# Patient Record
Sex: Female | Born: 1995 | Hispanic: Yes | Marital: Single | Smoking: Never smoker
Health system: Southern US, Community
[De-identification: ages and names within clinical notes are randomized; demographics above are authoritative.]

## PROBLEM LIST (undated history)

## (undated) ENCOUNTER — Emergency Department (HOSPITAL_COMMUNITY): Admission: EM | Payer: Self-pay

---

## 2018-09-06 ENCOUNTER — Emergency Department (HOSPITAL_COMMUNITY)
Admission: EM | Admit: 2018-09-06 | Discharge: 2018-09-06 | Disposition: A | Discharge status: Home / Self Care | Payer: Self-pay | Attending: Emergency Medicine | Admitting: Emergency Medicine

## 2018-09-06 ENCOUNTER — Encounter (HOSPITAL_COMMUNITY): Payer: Self-pay | Admitting: *Deleted

## 2018-09-06 ENCOUNTER — Other Ambulatory Visit: Payer: Self-pay

## 2018-09-06 ENCOUNTER — Emergency Department (HOSPITAL_COMMUNITY): Payer: Self-pay

## 2018-09-06 DIAGNOSIS — Y939 Activity, unspecified: Secondary | ICD-10-CM | POA: Insufficient documentation

## 2018-09-06 DIAGNOSIS — X501XXA Overexertion from prolonged static or awkward postures, initial encounter: Secondary | ICD-10-CM | POA: Insufficient documentation

## 2018-09-06 DIAGNOSIS — Y999 Unspecified external cause status: Secondary | ICD-10-CM | POA: Insufficient documentation

## 2018-09-06 DIAGNOSIS — Y929 Unspecified place or not applicable: Secondary | ICD-10-CM | POA: Insufficient documentation

## 2018-09-06 DIAGNOSIS — W19XXXA Unspecified fall, initial encounter: Secondary | ICD-10-CM

## 2018-09-06 DIAGNOSIS — S93401A Sprain of unspecified ligament of right ankle, initial encounter: Secondary | ICD-10-CM | POA: Insufficient documentation

## 2018-09-06 MED ORDER — IBUPROFEN 800 MG PO TABS: 800.0000 mg | ORAL_TABLET | Freq: Three times a day (TID) | ORAL | 0 refills | Status: DC

## 2018-09-06 MED ORDER — IBUPROFEN 800 MG PO TABS
800.0000 mg | ORAL_TABLET | Freq: Once | ORAL | Status: AC
  Administered 2018-09-06: 800 mg via ORAL
  Filled 2018-09-06: qty 1

## 2018-09-06 NOTE — Discharge Instructions (Signed)
Take the prescribed medication as directed.  Can ice and elevate ankle to help with pain/swelling.  Progress back to weight bearing as tolerated. Follow-up with orthopedics if any ongoing issues. Return to the ED for new or worsening symptoms.

## 2018-09-06 NOTE — ED Notes (Signed)
Patient transported to X-ray 

## 2018-09-06 NOTE — ED Provider Notes (Signed)
[facility] EMERGENCY DEPARTMENT Provider Note   CSN: [id number] Arrival date & time: 09/06/18  0116    History   Chief Complaint Chief Complaint  Patient presents with  . Fall    HPI Caitlin Herrera is a 23 y.o. female.     The history is provided by the patient and medical records. The history is limited by a language barrier. A language interpreter was used.  Fall      23 year old female presenting to the ED after a fall.  States she tried to jump over a step and twisted her right ankle.  She also scraped up her left foot but denies any pain in this area.  Pain along the lateral right ankle.  States she was not able to bear weight on ankle or foot after injury.  She denies any numbness or weakness.  No prior right ankle injuries or surgeries.  No intervention tried prior to arrival.  History reviewed. No pertinent past medical history.  There are no active problems to display for this patient.   History reviewed. No pertinent surgical history.   OB History   No obstetric history on file.      Home Medications    Prior to Admission medications   Not on File    Family History No family history on file.  Social History Social History   Tobacco Use  . Smoking status: Never Smoker  Substance Use Topics  . Alcohol use: Never    Frequency: Never  . Drug use: Never     Allergies   Patient has no known allergies.   Review of Systems Review of Systems  Musculoskeletal: Positive for arthralgias.  All other systems reviewed and are negative.    Physical Exam Updated Vital Signs BP 137/86 (BP Location: Right Arm)   Pulse (!) 110   Temp 98.3 F (36.8 C) (Oral)   Resp 18   LMP 09/03/2018   SpO2 99%   Physical Exam Vitals signs and nursing note reviewed.  Constitutional:      Appearance: She is well-developed.  HENT:     Head: Normocephalic and atraumatic.  Eyes:     Conjunctiva/sclera: Conjunctivae normal.     Pupils: Pupils  are equal, round, and reactive to light.  Neck:     Musculoskeletal: Normal range of motion.  Cardiovascular:     Rate and Rhythm: Normal rate and regular rhythm.     Heart sounds: Normal heart sounds.  Pulmonary:     Effort: Pulmonary effort is normal.     Breath sounds: Normal breath sounds.  Abdominal:     General: Bowel sounds are normal.     Palpations: Abdomen is soft.  Musculoskeletal: Normal range of motion.     Comments: Abrasions noted to the left great toe, denies pain of left ankle or foot  Right ankle with tenderness surrounding the lateral malleolus, very mild swelling but no appreciable bony deformities, pain with attempted range of motion; abrasion noted to dorsal right foot but no open wound requiring repair, DP pulse intact bilaterally, moving toes normally  Skin:    General: Skin is warm and dry.  Neurological:     Mental Status: She is alert and oriented to person, place, and time.      ED Treatments / Results  Labs (all labs ordered are listed, but only abnormal results are displayed) Labs Reviewed - No data to display  EKG None  Radiology Dg Ankle Complete Right  Result Date:  09/06/2018 CLINICAL DATA:  Right ankle and foot pain after a fall tonight. EXAM: RIGHT ANKLE - COMPLETE 3+ VIEW COMPARISON:  None. FINDINGS: There is no evidence of fracture, dislocation, or joint effusion. There is no evidence of arthropathy or other focal bone abnormality. Soft tissues are unremarkable. IMPRESSION: Negative. Electronically Signed   By: Burman Nieves M.D.   On: 09/06/2018 01:57   Dg Foot Complete Right  Result Date: 09/06/2018 CLINICAL DATA:  Right ankle and foot pain after a fall tonight. Swelling and abrasion to the right ankle. Abrasion to the left toe. EXAM: RIGHT FOOT COMPLETE - 3+ VIEW COMPARISON:  None. FINDINGS: There is no evidence of fracture or dislocation. There is no evidence of arthropathy or other focal bone abnormality. Soft tissues are  unremarkable. IMPRESSION: Negative. Electronically Signed   By: Burman Nieves M.D.   On: 09/06/2018 01:57    Procedures Procedures (including critical care time)  Medications Ordered in ED Medications - No data to display   Initial Impression / Assessment and Plan / ED Course  I have reviewed the triage vital signs and the nursing notes.  Pertinent labs & imaging results that were available during my care of the patient were reviewed by me and considered in my medical decision making (see chart for details).  23 year old female here after a fall.  She tried to jump down a step when she twisted her right ankle.  There was no head injury or loss of consciousness.  She has abrasions to the left great toe but denies any pain of this foot.  Small abrasion to dorsal right foot but no wounds requiring repair.  She has no acute bony deformities.  Some mild tenderness surrounding the lateral malleolus.  X-rays are negative.  Suspect sprain.  Patient placed in ASO given crutches.  Encouraged ice and elevation, anti-inflammatories.  Given orthopedic follow-up if any ongoing issues.  Return here for any new/acute changes.  Final Clinical Impressions(s) / ED Diagnoses   Final diagnoses:  Fall, initial encounter  Sprain of right ankle, unspecified ligament, initial encounter    ED Discharge Orders         Ordered    ibuprofen (ADVIL,MOTRIN) 800 MG tablet  3 times daily     09/06/18 0401           Garlon Hatchet, PA-C 09/06/18 0417    Gilda Crease, MD 09/06/18 [phone]

## 2018-09-06 NOTE — ED Triage Notes (Signed)
PT arrives with c/o right ankle and foot pain after a fall tonight. Says she missed the step and fell. She has swelling and abrasion to the right ankle. Abrasion to the left toe, but reports that does not hurt. Unknown tetanus. Video interpreter used for triage.

## 2018-09-06 NOTE — ED Notes (Signed)
Pt discharged from ED; instructions provided and scripts given; Pt encouraged to return to ED if symptoms worsen and to f/u with PCP; Pt verbalized understanding of all instructions 

## 2018-12-28 ENCOUNTER — Telehealth: Payer: Self-pay | Admitting: Family Medicine

## 2018-12-28 NOTE — Telephone Encounter (Signed)
Patient called about making an appointment to get her Nexplanon removed. The interpreter [phone] assisted me with explaining to her how much it would cost to get it removed. I explained to her it would be [facility] to [facility]. She stated it's ok she would  Pay the total. Instructed her it was 55% of total.

## 2019-02-12 ENCOUNTER — Ambulatory Visit (INDEPENDENT_AMBULATORY_CARE_PROVIDER_SITE_OTHER): Payer: Self-pay | Admitting: Family Medicine

## 2019-02-12 ENCOUNTER — Other Ambulatory Visit: Payer: Self-pay

## 2019-02-12 VITALS — Wt 165.0 lb

## 2019-02-12 DIAGNOSIS — Z3046 Encounter for surveillance of implantable subdermal contraceptive: Secondary | ICD-10-CM

## 2019-02-12 NOTE — Progress Notes (Signed)
Nexplanon Removal:  Patient given informed consent for removal of her Implanon, time out was performed.  Signed copy in the chart.  Appropriate time out taken. Implanon site identified.  Area prepped in usual sterile fashon. One cc of 1% lidocaine was used to anesthetize the area at the distal end of the implant. A small stab incision was made right beside the implant on the distal portion.  The implanon rod was grasped using hemostats and removed without difficulty.  There was less than 3 cc blood loss. There were no complications.  A small amount of antibiotic ointment and steri-strips were applied over the small incision.  A pressure bandage was applied to reduce any bruising.  The patient tolerated the procedure well and was given post procedure instructions. 

## 2019-02-15 ENCOUNTER — Encounter: Payer: Self-pay | Admitting: General Practice

## 2020-05-02 ENCOUNTER — Inpatient Hospital Stay (HOSPITAL_COMMUNITY): Payer: Self-pay

## 2020-05-02 ENCOUNTER — Other Ambulatory Visit: Payer: Self-pay

## 2020-05-02 ENCOUNTER — Encounter (HOSPITAL_COMMUNITY): Payer: Self-pay | Admitting: Obstetrics and Gynecology

## 2020-05-02 ENCOUNTER — Inpatient Hospital Stay (HOSPITAL_COMMUNITY)
Admission: AD | Admit: 2020-05-02 | Discharge: 2020-05-02 | Disposition: A | Discharge status: Home / Self Care | Payer: Self-pay | Attending: Obstetrics and Gynecology | Admitting: Obstetrics and Gynecology

## 2020-05-02 DIAGNOSIS — Z3A08 8 weeks gestation of pregnancy: Secondary | ICD-10-CM | POA: Insufficient documentation

## 2020-05-02 DIAGNOSIS — O26899 Other specified pregnancy related conditions, unspecified trimester: Secondary | ICD-10-CM

## 2020-05-02 DIAGNOSIS — R109 Unspecified abdominal pain: Secondary | ICD-10-CM | POA: Insufficient documentation

## 2020-05-02 DIAGNOSIS — O039 Complete or unspecified spontaneous abortion without complication: Secondary | ICD-10-CM | POA: Insufficient documentation

## 2020-05-02 LAB — URINALYSIS, ROUTINE W REFLEX MICROSCOPIC
Bacteria, UA: NONE SEEN
Bilirubin Urine: NEGATIVE
Glucose, UA: NEGATIVE mg/dL
Ketones, ur: NEGATIVE mg/dL
Leukocytes,Ua: NEGATIVE
Nitrite: NEGATIVE
Protein, ur: NEGATIVE mg/dL
Specific Gravity, Urine: 1.012 (ref 1.005–1.030)
pH: 8 (ref 5.0–8.0)

## 2020-05-02 LAB — POC URINE PREG, ED: Preg Test, Ur: POSITIVE — AB

## 2020-05-02 LAB — WET PREP, GENITAL
Clue Cells Wet Prep HPF POC: NONE SEEN
Sperm: NONE SEEN
Trich, Wet Prep: NONE SEEN
Yeast Wet Prep HPF POC: NONE SEEN

## 2020-05-02 LAB — CBC
HCT: 39.6 % (ref 36.0–46.0)
Hemoglobin: 13.1 g/dL (ref 12.0–15.0)
MCH: 27.6 pg (ref 26.0–34.0)
MCHC: 33.1 g/dL (ref 30.0–36.0)
MCV: 83.5 fL (ref 80.0–100.0)
Platelets: 255 10*3/uL (ref 150–400)
RBC: 4.74 MIL/uL (ref 3.87–5.11)
RDW: 12.2 % (ref 11.5–15.5)
WBC: 6.7 10*3/uL (ref 4.0–10.5)
nRBC: 0 % (ref 0.0–0.2)

## 2020-05-02 LAB — ABO/RH: ABO/RH(D): O POS

## 2020-05-02 LAB — HCG, QUANTITATIVE, PREGNANCY: hCG, Beta Chain, Quant, S: 85480 m[IU]/mL — ABNORMAL HIGH (ref <5)

## 2020-05-02 NOTE — MAU Note (Addendum)
Patient reports she is having right lower abdominal pain yesterday and some orange discharge that started today. Denies any vaginal odor.  LMP 9/10.  States she hasn't taken anything for her pain.  Had an ultrasound done last week and that the FHR was low (107bpm).

## 2020-05-02 NOTE — ED Notes (Signed)
Report called to [facility], RN in MAU and transport contacted to take patient over to MAU at this time.

## 2020-05-02 NOTE — ED Triage Notes (Signed)
Pt is [redacted] weeks pregnant with her first pregnancy, she reports she is 8 weeks confirmed by [place] but was told at that time the baby's heart rate was low around 107. Pt is here today for 2 days of lower cramps and vaginal spotting.

## 2020-05-02 NOTE — Discharge Instructions (Signed)
Aborto espontneo Miscarriage El aborto espontneo es la prdida de un beb que no ha nacido (feto) antes de la semana20 del embarazo. Siga estas indicaciones en su casa: Medicamentos   Tome los medicamentos de venta libre y los recetados solamente como se lo haya indicado el mdico.  Si le recetaron un antibitico, tmelo como se lo haya indicado el mdico. No deje de tomar los antibiticos aunque comience a sentirse mejor.  No tome antiinflamatorios no esteroideos (AINE), a menos que el mdico le diga que son seguros para usted. Estos incluyen aspirina e ibuprofeno. Estos medicamentos pueden provocarle sangrado. Actividad  Haga reposo segn lo indicado. Pregntele al mdico qu actividades son seguras para usted.  Pida ayuda para realizar las tareas de la casa durante este tiempo. Instrucciones generales  Anote cuntos apsitos usa por da y cun saturados estn.  Observe la cantidad de tejido o grumos de sangre (cogulos de sangre) que expulsa por la vagina. Guarde las cantidades grandes de tejido para llevrselas al mdico.  No use tampones, no se haga duchas vaginales ni tenga relaciones sexuales hasta que el mdico la autorice.  Para que usted y su pareja puedan sobrellevar el proceso de duelo, hable con su mdico o busque apoyo psicolgico.  Cuando est lista, acuda al mdico para hablar sobre los pasos que debe seguir para cuidar su salud. Adems, hable con su mdico sobre las medidas que debe adoptar para tener un embarazo saludable en el futuro.  Concurra a todas las visitas de seguimiento como se lo haya indicado el mdico. Esto es importante. Comunquese con un mdico si:  Tiene fiebre o siente escalofros.  Tiene una secrecin vaginal con mal olor.  Aumenta el sangrado. Solicite ayuda de inmediato si:  Tiene espasmos o dolor muy intensos en el abdomen o en la espalda.  Elimina grumos de sangre por la vagina, que tienen el tamao de una nuez o ms.  Elimina  tejido por la vagina, que tiene el tamao de una nuez o ms.  Empapa ms de un apsito de tamao normal por hora.  Se siente dbil o mareada.  Pierde el conocimiento (se desmaya).  Siente tristeza que no se va o piensa en lastimarse. Resumen  El aborto espontneo es la prdida de un beb que no ha nacido antes de la semana20 del embarazo.  Siga las indicaciones de su mdico para el cuidado en su hogar. Concurra a todas las visitas de control.  Para que usted y su pareja puedan sobrellevar el proceso de duelo, hable con su mdico o busque apoyo psicolgico. Esta informacin no tiene como fin reemplazar el consejo del mdico. Asegrese de hacerle al mdico cualquier pregunta que tenga. Document Revised: 03/21/2017 Document Reviewed: 03/21/2017 Elsevier Patient Education  2020 Elsevier Inc.  

## 2020-05-02 NOTE — MAU Provider Note (Signed)
Chief Complaint:  Abdominal Pain and Vaginal Bleeding   First Provider Initiated Contact with Patient 05/02/20 1657     HPI: Caitlin Herrera is a 24 y.o. G1P0 at [redacted]w[redacted]d who presents to maternity admissions (transferred from the ED) reporting lower abdominal cramping and some pink-brown spotting. Both began earlier today. The ED note states that she was seen by a "free clinic" and an ultrasound done there showed an IUP at [redacted]wks gestation with a FHR of 107. When asked which "free clinic" the pt responded "on [address]." Denies fever, falls, or recent illness. Has had some nausea and vomiting but less today.   No past medical history on file. OB History  Gravida Para Term Preterm AB Living  1            SAB TAB Ectopic Multiple Live Births               # Outcome Date GA Lbr Len/2nd Weight Sex Delivery Anes PTL Lv  1 Current            No past surgical history on file. No family history on file. Social History   Tobacco Use  . Smoking status: Never Smoker  Substance Use Topics  . Alcohol use: Never  . Drug use: Never   No Known Allergies No medications prior to admission.    I have reviewed patient's Past Medical Hx, Surgical Hx, Family Hx, Social Hx, medications and allergies.   ROS:  Review of Systems  Constitutional: Negative for fatigue and fever.  HENT: Negative for congestion and sore throat.   Eyes: Negative for visual disturbance.  Respiratory: Negative for shortness of breath.   Gastrointestinal: Positive for abdominal pain, nausea and vomiting.  Genitourinary: Positive for pelvic pain, vaginal bleeding and vaginal discharge.  Neurological: Negative for dizziness, syncope, light-headedness and headaches.  All other systems reviewed and are negative.   Physical Exam   Patient Vitals for the past 24 hrs:  BP Temp Temp src Pulse Resp SpO2 Height  05/02/20 2104 127/69 -- Oral 96 -- 100 % --  05/02/20 1609 112/64 -- -- 81 -- -- --  05/02/20 1422 118/67 98.2 F (36.8 C)  Oral 85 16 100 % 5\' 9"  (1.753 m)    Constitutional: Well-developed, well-nourished female in no acute distress.  Cardiovascular: normal rate & rhythm, no murmur Respiratory: normal effort, lung sounds clear throughout GI: Abd soft, non-tender, gravid appropriate for gestational age. Pos BS x 4 MS: Extremities nontender, no edema, normal ROM Neurologic: Alert and oriented x 4.  Pelvic: NEFG, scant pink/brown discharge noted near cervix  Labs: Results for orders placed or performed during the hospital encounter of 05/02/20 (from the past 24 hour(s))  POC Urine Pregnancy, ED (not at [facility]     Status: Abnormal   Collection Time: 05/02/20  3:06 PM  Result Value Ref Range   Preg Test, Ur POSITIVE (A) NEGATIVE  Urinalysis, Routine w reflex microscopic Urine, Clean Catch     Status: Abnormal   Collection Time: 05/02/20  4:10 PM  Result Value Ref Range   Color, Urine YELLOW YELLOW   APPearance CLEAR CLEAR   Specific Gravity, Urine 1.012 1.005 - 1.030   pH 8.0 5.0 - 8.0   Glucose, UA NEGATIVE NEGATIVE mg/dL   Hgb urine dipstick SMALL (A) NEGATIVE   Bilirubin Urine NEGATIVE NEGATIVE   Ketones, ur NEGATIVE NEGATIVE mg/dL   Protein, ur NEGATIVE NEGATIVE mg/dL   Nitrite NEGATIVE NEGATIVE   Leukocytes,Ua NEGATIVE NEGATIVE  RBC / HPF 0-5 0 - 5 RBC/hpf   WBC, UA 0-5 0 - 5 WBC/hpf   Bacteria, UA NONE SEEN NONE SEEN   Squamous Epithelial / LPF 0-5 0 - 5  Wet prep, genital     Status: Abnormal   Collection Time: 05/02/20  5:22 PM  Result Value Ref Range   Yeast Wet Prep HPF POC NONE SEEN NONE SEEN   Trich, Wet Prep NONE SEEN NONE SEEN   Clue Cells Wet Prep HPF POC NONE SEEN NONE SEEN   WBC, Wet Prep HPF POC FEW (A) NONE SEEN   Sperm NONE SEEN   CBC     Status: None   Collection Time: 05/02/20  5:46 PM  Result Value Ref Range   WBC 6.7 4.0 - 10.5 K/uL   RBC 4.74 3.87 - 5.11 MIL/uL   Hemoglobin 13.1 12.0 - 15.0 g/dL   HCT 60.6 36 - 46 %   MCV 83.5 80.0 - 100.0 fL   MCH 27.6 26.0 - 34.0  pg   MCHC 33.1 30.0 - 36.0 g/dL   RDW 30.1 60.1 - 09.3 %   Platelets 255 150 - 400 K/uL   nRBC 0.0 0.0 - 0.2 %  ABO/Rh     Status: None   Collection Time: 05/02/20  5:46 PM  Result Value Ref Range   ABO/RH(D)      O POS Performed at [facility], [address] [place], [place] [address]   hCG, quantitative, pregnancy     Status: Abnormal   Collection Time: 05/02/20  5:46 PM  Result Value Ref Range   hCG, Beta Chain, Quant, S 85,480 (H) <5 mIU/mL    Imaging:  When informing pt of today's imaging results, pt asked how they could've seen a FHR a week ago but none today. I was able to clarify that "free clinic on [address]" was not accurate, she was seen by [facility]. The pt pulled up photos sent to her after her initial ultrasound and a video showing obvious an obvious fetal heart beat.          US OB LESS THAN 14 WEEKS WITH OB TRANSVAGINAL  Result Date: 05/02/2020 CLINICAL DATA:  Right lower quadrant abdominal pain EXAM: OBSTETRIC <14 WK [place] AND TRANSVAGINAL OB US TECHNIQUE: Both transabdominal and transvaginal ultrasound examinations were performed for complete evaluation of the gestation as well as the maternal uterus, adnexal regions, and pelvic cul-de-sac. Transvaginal technique was performed to assess early pregnancy. COMPARISON:  None. FINDINGS: Intrauterine gestational sac: Single Yolk sac:  Visualized. Embryo:  Visualized. Cardiac Activity: Not Visualized. CRL:  3.5 mm   6 w   0 d Subchorionic hemorrhage:  None visualized. Maternal uterus/adnexae: Normal appearing ovaries. No free fluid seen within the cul-de-sac. IMPRESSION: Findings are suspicious but not yet definitive for failed pregnancy and could be too early to visualize cardiac activity. Recommend follow-up US in 10-14 days for definitive diagnosis. This recommendation follows SRU consensus guidelines: Diagnostic Criteria for Nonviable Pregnancy Early in the First Trimester. Malva Limes Med 2013; 322:0254-27.  Electronically Signed   By: Jonna Clark M.D.   On: 05/02/2020 19:28   MAU Course: Orders Placed This Encounter  Procedures  . Wet prep, genital  . US OB LESS THAN 14 WEEKS WITH OB TRANSVAGINAL  . Urinalysis, Routine w reflex microscopic Urine, Clean Catch  . CBC  . hCG, quantitative, pregnancy  . POC Urine Pregnancy, ED (not at [facility])  . ABO/Rh  .  Discharge patient   MDM: Wet prep normal, gc/ct pending CBC normal Blood type: O+ Discussed ultrasound report initially with Dr. Earlene Plater who recommended a follow up ultrasound in one week. Once I clarified the actual location of the previous ultrasound, I consulted with Dr. Earlene Plater again and confirmed that with a comparison, today's ultrasound is definitive for a SAB.  Informed the patient and provided emotional support and education about the cause of SAB, expectant management (pt's choice), and likelihood of future fertility. Gave general encouragement, including showing the pt today's ultrasound and comparing the fetal heart movement in the first video to today's. Pt sad/tearful but verbalized understanding and desire for expectant management.  Called [facility] and spoke to Dr. Amado Nash and Dorisann Frames, CNM who will notify their office to schedule appropriate follow up.   Assessment: 1. Spontaneous abortion in first trimester   2. Abdominal cramping affecting pregnancy    Plan: Discharge home in stable condition with bleeding precautions.    Follow-up Information    [facility], [facility]. Call on 05/05/2020.   Why: Call to schedule your appointment for miscarriage follow up Contact information: [address] [place] [address] [phone]               Allergies as of 05/02/2020   No Known Allergies     Medication List    TAKE these medications   ibuprofen 800 MG tablet Commonly known as: ADVIL Take 1 tablet (800 mg total) by mouth 3 (three) times daily.   prenatal multivitamin Tabs tablet Take 1 tablet by mouth  daily at 12 noon.      Edd Arbour, CNM, MSN, [facility] 05/02/20 9:25 PM

## 2020-05-02 NOTE — ED Provider Notes (Signed)
Over the last 2 she has this patient by nursing staff.  24 year old Spanish-speaking female presents to the ER with complaints of several days of abdominal cramping and spotting which began today.  Patient states that she is approximately [redacted] weeks pregnant, was seen at a free clinic and had an ultrasound done approximately a week ago, was told that babies heart rate was at about 107.  She states she has pending OB/GYN follow-up however once the cramping started, she states that it was too far away and needs further evaluation.  On exam, abdomen is soft and nontender.  Mildly distended consistent with pregnancy.  Vitals overall reassuring.  Pregnancy testing is positive.  Spoke with MAU provider, they will accept the patient for further evaluation work-up.     Mare Ferrari, PA-C 05/02/20 1521    Maia Plan, MD 05/03/20 [phone]

## 2020-05-02 NOTE — ED Notes (Signed)
PA Belaya advised MAU has been notified of patient and patient is okay for transport there at this time.

## 2020-05-03 ENCOUNTER — Emergency Department (HOSPITAL_BASED_OUTPATIENT_CLINIC_OR_DEPARTMENT_OTHER)
Admission: EM | Admit: 2020-05-03 | Discharge: 2020-05-03 | Disposition: A | Discharge status: Home / Self Care | Payer: Self-pay | Attending: Emergency Medicine | Admitting: Emergency Medicine

## 2020-05-03 ENCOUNTER — Encounter (HOSPITAL_BASED_OUTPATIENT_CLINIC_OR_DEPARTMENT_OTHER): Payer: Self-pay | Admitting: Emergency Medicine

## 2020-05-03 ENCOUNTER — Other Ambulatory Visit: Payer: Self-pay

## 2020-05-03 DIAGNOSIS — Z3A01 Less than 8 weeks gestation of pregnancy: Secondary | ICD-10-CM | POA: Insufficient documentation

## 2020-05-03 DIAGNOSIS — O039 Complete or unspecified spontaneous abortion without complication: Secondary | ICD-10-CM | POA: Insufficient documentation

## 2020-05-03 NOTE — ED Provider Notes (Signed)
[facility] EMERGENCY DEPARTMENT Provider Note   CSN: [id number] Arrival date & time: 05/03/20  1540     History Chief Complaint  Patient presents with  . Abdominal Cramping    Caitlin Herrera is a 24 y.o. female.  Caitlin Herrera is a 24 y.o. female who is otherwise healthy, presents to the emergency department with concerns about her current pregnancy.  She was seen in the MAU yesterday for some cramping and vaginal bleeding and had an ultrasound done which showed a 6-week pregnancy but with no cardiac activity.  Patient states that she had had an ultrasound done a week prior at [facility] that showed cardiac activity but with a heart rate slower than expected at 107.  I reviewed patient's notes from the MAU yesterday and appears that they are working on arranging follow-up with her with her OB/GYN in about a week, but told her that she could expect to experience some potential additional cramping or bleeding, patient came back today concerned about what to do about this miscarriage.  She states she still having some cramping but not any bleeding.  She was confused about what the next steps were. Denies any new pain or symptoms.   Patient is accompanied by her mom.  History obtained using Spanish interpreter.        History reviewed. No pertinent past medical history.  There are no problems to display for this patient.   History reviewed. No pertinent surgical history.   OB History    Gravida  1   Para      Term      Preterm      AB      Living        SAB      TAB      Ectopic      Multiple      Live Births              No family history on file.  Social History   Tobacco Use  . Smoking status: Never Smoker  Substance Use Topics  . Alcohol use: Never  . Drug use: Never    Home Medications Prior to Admission medications   Medication Sig Start Date End Date Taking? Authorizing Provider  ibuprofen (ADVIL,MOTRIN) 800 MG tablet Take 1  tablet (800 mg total) by mouth 3 (three) times daily. 09/06/18   Garlon Hatchet, PA-C  Prenatal Vit-Fe Fumarate-FA (PRENATAL MULTIVITAMIN) TABS tablet Take 1 tablet by mouth daily at 12 noon.    [provider]    Allergies    Patient has no known allergies.  Review of Systems   Review of Systems  Constitutional: Negative for chills and fever.  Respiratory: Negative for shortness of breath.   Cardiovascular: Negative for chest pain.  Gastrointestinal: Positive for abdominal pain (Cramping). Negative for nausea and vomiting.  Genitourinary: Positive for pelvic pain. Negative for dysuria, frequency, vaginal bleeding and vaginal discharge.  All other systems reviewed and are negative.   Physical Exam Updated Vital Signs BP 113/71 (BP Location: Left Arm)   Pulse 83   Temp 98.4 F (36.9 C)   Resp 18   Ht 5\' 9"  (1.753 m)   Wt 74.8 kg   LMP 03/07/2020   SpO2 100%   BMI 24.35 kg/m   Physical Exam Vitals and nursing note reviewed.  Constitutional:      General: She is not in acute distress.    Appearance: Normal appearance. She is well-developed and  normal weight. She is not ill-appearing or diaphoretic.  HENT:     Head: Normocephalic and atraumatic.  Eyes:     General:        Right eye: No discharge.        Left eye: No discharge.  Cardiovascular:     Rate and Rhythm: Normal rate and regular rhythm.     Heart sounds: Normal heart sounds.  Pulmonary:     Effort: Pulmonary effort is normal. No respiratory distress.     Breath sounds: Normal breath sounds.     Comments: Well-appearing and in no distress Abdominal:     General: Abdomen is flat. Bowel sounds are normal. There is no distension.     Palpations: Abdomen is soft. There is no mass.     Tenderness: There is no abdominal tenderness. There is no guarding.     Comments: Abdomen soft, nondistended, nontender to palpation in all quadrants without guarding or peritoneal signs  Musculoskeletal:        General:  No deformity.  Skin:    General: Skin is warm and dry.  Neurological:     Mental Status: She is alert and oriented to person, place, and time.     Coordination: Coordination normal.  Psychiatric:        Mood and Affect: Mood normal.        Behavior: Behavior normal.     ED Results / Procedures / Treatments   Labs (all labs ordered are listed, but only abnormal results are displayed) Labs Reviewed - No data to display  EKG None  Radiology US OB LESS THAN 14 WEEKS WITH OB TRANSVAGINAL  Result Date: 05/02/2020 CLINICAL DATA:  Right lower quadrant abdominal pain EXAM: OBSTETRIC <14 WK [place] AND TRANSVAGINAL OB US TECHNIQUE: Both transabdominal and transvaginal ultrasound examinations were performed for complete evaluation of the gestation as well as the maternal uterus, adnexal regions, and pelvic cul-de-sac. Transvaginal technique was performed to assess early pregnancy. COMPARISON:  None. FINDINGS: Intrauterine gestational sac: Single Yolk sac:  Visualized. Embryo:  Visualized. Cardiac Activity: Not Visualized. CRL:  3.5 mm   6 w   0 d Subchorionic hemorrhage:  None visualized. Maternal uterus/adnexae: Normal appearing ovaries. No free fluid seen within the cul-de-sac. IMPRESSION: Findings are suspicious but not yet definitive for failed pregnancy and could be too early to visualize cardiac activity. Recommend follow-up US in 10-14 days for definitive diagnosis. This recommendation follows SRU consensus guidelines: Diagnostic Criteria for Nonviable Pregnancy Early in the First Trimester. Malva Limes Med 2013; 950:9326-71. Electronically Signed   By: Jonna Clark M.D.   On: 05/02/2020 19:28    Procedures Procedures (including critical care time)  Medications Ordered in ED Medications - No data to display  ED Course  I have reviewed the triage vital signs and the nursing notes.  Pertinent labs & imaging results that were available during my care of the patient were reviewed by me and  considered in my medical decision making (see chart for details).    MDM Rules/Calculators/A&P                         24 year old female today presents with some confusion about what to do next after she was seen at the MAU yesterday and told that she was having a spontaneous abortion.  She had had an ultrasound a week prior that showed an intrauterine pregnancy with a heart rate of 107 which was a bit lower  than expected, she had a repeat ultrasound yesterday that did not show any cardiac activity, yesterday she was having some cramping and bleeding but now bleeding has stopped.  She was not sure what to do today, and initially presented thinking that she needed another ultrasound to check for heartbeat.  I explained to her and her mother in detail using the Spanish interpreter that there was no cardiac activity seen on the ultrasound yesterday, and that they were arranging follow-up with her at her OB/GYN and that she may have bleeding again and passed the products of conception but if not they can discuss potential medications or procedures needed to remove these products of conception.  I allowed ample time for patient and mother to ask questions.  I performed bedside Doppler with no detectable fetal heart tones and explained plan for close follow-up this week and what to anticipate over the next few days.  Patient expresses understanding and agreement.  She understands that there is no utility in repeating ultrasound today that she should follow-up with her OB/GYN as planned.  Patient discharged home in good condition.  Final Clinical Impression(s) / ED Diagnoses Final diagnoses:  Spontaneous abortion in first trimester    Rx / DC Orders ED Discharge Orders    None       Legrand Rams 05/03/20 2351    Rolan Bucco, MD 05/04/20 [phone]

## 2020-05-03 NOTE — Discharge Instructions (Signed)
Please call to schedule close follow-up with your OB/GYN if you do not hear from them on Monday. You may have bleeding cramping and passed large clots or products of conception over the next few days if this does not happen then when you follow-up with your OB/GYN they will discuss other options such as medications or procedures that they think would be best with you to help pass the products of conception after an abortion.  If you have any issues in the meantime you can call your OB/GYN office or go to the MAU at the [facility] at [facility], this is like an emergency department for any issues related to pregnancy.  ----------------------------------------------------------------------  Llame para programar un seguimiento cercano con su obstetra / gineclogo si no tiene noticias de l el lunes. Es posible que tenga clicos sangrantes y que expulse grandes cogulos o productos de la concepcin durante los prximos [place], si esto no sucede, cuando haga un seguimiento con su obstetra / gineclogo, analizarn otras opciones, como medicamentos o procedimientos, que ellos creen que seran los mejores. contigo para ayudar a [employer] de la concepcin despus de un aborto.  Si tiene algn problema [employer], puede llamar a la oficina de su obstetra / gineclogo o ir al MAU en [employer] para mujeres y nios en [place], esto es como un departamento de emergencias para cualquier problema relacionado con [employer].

## 2020-05-03 NOTE — ED Triage Notes (Signed)
Reports being seen at the MAU yesterday.  Was told she was having a miscarriage and would have some bleeding in the next few days.  Here because she is having cramping but no bleeding.

## 2020-05-05 LAB — GC/CHLAMYDIA PROBE AMP (~~LOC~~) NOT AT ARMC
Chlamydia: NEGATIVE
Comment: NEGATIVE
Comment: NORMAL
Neisseria Gonorrhea: NEGATIVE

## 2020-05-19 ENCOUNTER — Inpatient Hospital Stay (HOSPITAL_COMMUNITY)
Admission: AD | Admit: 2020-05-19 | Discharge: 2020-05-19 | Disposition: A | Discharge status: Home / Self Care | Payer: Self-pay | Attending: Obstetrics | Admitting: Obstetrics

## 2020-05-19 ENCOUNTER — Encounter (HOSPITAL_COMMUNITY): Payer: Self-pay | Admitting: Obstetrics and Gynecology

## 2020-05-19 ENCOUNTER — Inpatient Hospital Stay (HOSPITAL_COMMUNITY): Payer: Self-pay

## 2020-05-19 ENCOUNTER — Other Ambulatory Visit: Payer: Self-pay

## 2020-05-19 DIAGNOSIS — O26891 Other specified pregnancy related conditions, first trimester: Secondary | ICD-10-CM | POA: Insufficient documentation

## 2020-05-19 DIAGNOSIS — O039 Complete or unspecified spontaneous abortion without complication: Secondary | ICD-10-CM

## 2020-05-19 DIAGNOSIS — O034 Incomplete spontaneous abortion without complication: Secondary | ICD-10-CM | POA: Insufficient documentation

## 2020-05-19 DIAGNOSIS — Z3A10 10 weeks gestation of pregnancy: Secondary | ICD-10-CM | POA: Insufficient documentation

## 2020-05-19 DIAGNOSIS — R109 Unspecified abdominal pain: Secondary | ICD-10-CM | POA: Insufficient documentation

## 2020-05-19 LAB — CBC
HCT: 36.1 % (ref 36.0–46.0)
Hemoglobin: 11.9 g/dL — ABNORMAL LOW (ref 12.0–15.0)
MCH: 27.9 pg (ref 26.0–34.0)
MCHC: 33 g/dL (ref 30.0–36.0)
MCV: 84.7 fL (ref 80.0–100.0)
Platelets: 186 10*3/uL (ref 150–400)
RBC: 4.26 MIL/uL (ref 3.87–5.11)
RDW: 12.6 % (ref 11.5–15.5)
WBC: 9.1 10*3/uL (ref 4.0–10.5)
nRBC: 0 % (ref 0.0–0.2)

## 2020-05-19 MED ORDER — KETOROLAC TROMETHAMINE 60 MG/2ML IM SOLN
60.0000 mg | Freq: Once | INTRAMUSCULAR | Status: AC
  Administered 2020-05-19: 60 mg via INTRAMUSCULAR
  Filled 2020-05-19: qty 2

## 2020-05-19 MED ORDER — METHYLERGONOVINE MALEATE 0.2 MG PO TABS: 0.2000 mg | ORAL_TABLET | Freq: Three times a day (TID) | ORAL | 0 refills | Status: DC

## 2020-05-19 MED ORDER — DOXYCYCLINE MONOHYDRATE 100 MG PO TABS: 100.0000 mg | ORAL_TABLET | Freq: Two times a day (BID) | ORAL | 0 refills | Status: DC

## 2020-05-19 NOTE — Discharge Instructions (Signed)
Aborto espontneo Miscarriage El aborto espontneo es la prdida de un beb que no ha nacido (feto) antes de la semana20 del embarazo. Siga estas indicaciones en su casa: Medicamentos   Tome los medicamentos de venta libre y los recetados solamente como se lo haya indicado el mdico.  Si le recetaron un antibitico, tmelo como se lo haya indicado el mdico. No deje de tomar los antibiticos aunque comience a sentirse mejor.  No tome antiinflamatorios no esteroideos (AINE), a menos que el mdico le diga que son seguros para usted. Estos incluyen aspirina e ibuprofeno. Estos medicamentos pueden provocarle sangrado. Actividad  Haga reposo segn lo indicado. Pregntele al mdico qu actividades son seguras para usted.  Pida ayuda para realizar las tareas de la casa durante este tiempo. Instrucciones generales  Anote cuntos apsitos usa por da y cun saturados estn.  Observe la cantidad de tejido o grumos de sangre (cogulos de sangre) que expulsa por la vagina. Guarde las cantidades grandes de tejido para llevrselas al mdico.  No use tampones, no se haga duchas vaginales ni tenga relaciones sexuales hasta que el mdico la autorice.  Para que usted y su pareja puedan sobrellevar el proceso de duelo, hable con su mdico o busque apoyo psicolgico.  Cuando est lista, acuda al mdico para hablar sobre los pasos que debe seguir para cuidar su salud. Adems, hable con su mdico sobre las medidas que debe adoptar para tener un embarazo saludable en el futuro.  Concurra a todas las visitas de seguimiento como se lo haya indicado el mdico. Esto es importante. Comunquese con un mdico si:  Tiene fiebre o siente escalofros.  Tiene una secrecin vaginal con mal olor.  Aumenta el sangrado. Solicite ayuda de inmediato si:  Tiene espasmos o dolor muy intensos en el abdomen o en la espalda.  Elimina grumos de sangre por la vagina, que tienen el tamao de una nuez o ms.  Elimina  tejido por la vagina, que tiene el tamao de una nuez o ms.  Empapa ms de un apsito de tamao normal por hora.  Se siente dbil o mareada.  Pierde el conocimiento (se desmaya).  Siente tristeza que no se va o piensa en lastimarse. Resumen  El aborto espontneo es la prdida de un beb que no ha nacido antes de la semana20 del embarazo.  Siga las indicaciones de su mdico para el cuidado en su hogar. Concurra a todas las visitas de control.  Para que usted y su pareja puedan sobrellevar el proceso de duelo, hable con su mdico o busque apoyo psicolgico. Esta informacin no tiene como fin reemplazar el consejo del mdico. Asegrese de hacerle al mdico cualquier pregunta que tenga. Document Revised: 03/21/2017 Document Reviewed: 03/21/2017 Elsevier Patient Education  2020 Elsevier Inc.  

## 2020-05-19 NOTE — MAU Provider Note (Signed)
Patient Caitlin Herrera is a 24 y.o.  G1P0  at [redacted]w[redacted]d  Here with complaint of abdominal pain that started on Friday. She also reports nausea/vomiting and diarrhea.   She was diagnosed with failed pregnancy on 11/5; desired expectant management. She was then seen at [facility] on 11/16 for follow up. At that visit she was given 8 cytotec pills to take at home. She took them on Wednesday, 11/17. SHe had a little bit of bleeding on Thursday, 11/18, and so she took the 2nd dose of cytotec on Thursday   Since Thursday, she has had minimal bleeding, only going through 1-3 pads a day and they are not saturated. However, she started having strong abdominal pains today.    She is a patient of [employer]; patient showed me electronic appts made on her cell phone.  History     CSN: [id number]  Arrival date and time: 05/19/20 1201   First Provider Initiated Contact with Patient 05/19/20 1336      Chief Complaint  Patient presents with  . Abdominal Pain   Abdominal Pain This is a new problem. The current episode started in the past 7 days. The onset quality is gradual. The problem occurs constantly. The problem has been gradually worsening. The pain is located in the suprapubic region. The pain is at a severity of 10/10. The quality of the pain is cramping. Associated symptoms include diarrhea and nausea.   She also reports some light bleeding, she has been passing small clots but has not had any heavy bleeding. She is changing her pad 1-3 times per day and she is not saturating them.  She has had some nausea and diarrhea, but none while in MAU.  OB History    Gravida  1   Para      Term      Preterm      AB      Living        SAB      TAB      Ectopic      Multiple      Live Births              History reviewed. No pertinent past medical history.  History reviewed. No pertinent surgical history.  History reviewed. No pertinent family history.  Social History    Tobacco Use  . Smoking status: Never Smoker  Substance Use Topics  . Alcohol use: Never  . Drug use: Never    Allergies: No Known Allergies  Medications Prior to Admission  Medication Sig Dispense Refill Last Dose  . ibuprofen (ADVIL,MOTRIN) 800 MG tablet Take 1 tablet (800 mg total) by mouth 3 (three) times daily. 21 tablet 0 05/18/2020 at Unknown time  . Prenatal Vit-Fe Fumarate-FA (PRENATAL MULTIVITAMIN) TABS tablet Take 1 tablet by mouth daily at 12 noon.   More than a month at Unknown time    Review of Systems  Constitutional: Negative.   HENT: Negative.   Respiratory: Negative.   Gastrointestinal: Positive for abdominal pain, diarrhea and nausea.  Genitourinary: Positive for vaginal bleeding.  Neurological: Negative.   Hematological: Negative.   Psychiatric/Behavioral: Negative.    Physical Exam   Blood pressure (!) 108/57, pulse 89, temperature 99 F (37.2 C), temperature source Oral, resp. rate 18, height 5\' 9"  (1.753 m), weight 69.4 kg, last menstrual period 03/07/2020, SpO2 100 %.  Physical Exam Constitutional:      Appearance: She is well-developed.  HENT:     Head:  Normocephalic.  Abdominal:     General: Abdomen is flat.     Palpations: Abdomen is soft.  Genitourinary:    Vagina: No tenderness.     Comments: Gentle bimanual exam done; slight tenderness over suprapubic region, cervix is non-tender, long, closed.  Neurological:     Mental Status: She is alert.     MAU Course  Procedures  MDM -US done to check if any findings of retained products of conception.  I have independently reviewed the [place] images, which reveal finding of retained POC/hematoma.   Labs:  -Patient does not have a white count - hemoglobin is above 11.    -After 60 ml of toradol IM, she reports that she has pain 2/10.   Discussed findings with Dr. Earlene Plater, who recommends another round of cytotec plus antibiotics, or community MD may want to schedule D and C. Also recommends  close follow-up.   TC to Dr. Algie Coffer, who recommends methergine series, urine culture, doxy, and follow up in the office.  Assessment and Plan   1. Retained products of conception after miscarriage   2. Abdominal pain   3. Miscarriage    2. Patient stable for discharge with instructions to take methergine, doxy, and return to MAU if fever, painful heavy bleeding.  3. Urine culture is pending.   4. Chart forwarded to Dr. Ernestina Penna, who will alert staff to schedule follow-up.   Charlesetta Garibaldi Vora Clover 05/19/2020, 1:43 PM

## 2020-05-19 NOTE — MAU Note (Signed)
Presents with lower abdominal pain since last night.  States had a recent miscarriage, was given medicine to pass POC.  Last dose was taken last Thursday.

## 2020-05-20 LAB — CULTURE, OB URINE: Culture: <10000 — AB

## 2020-06-20 IMAGING — DX RIGHT ANKLE - COMPLETE 3+ VIEW
3 series · 3 of 3 positions shown · non-contrast
Comparison: None.

CLINICAL DATA: Right ankle and foot pain after a fall tonight.

EXAM:
RIGHT ANKLE - COMPLETE 3+ VIEW

[ankle ap]
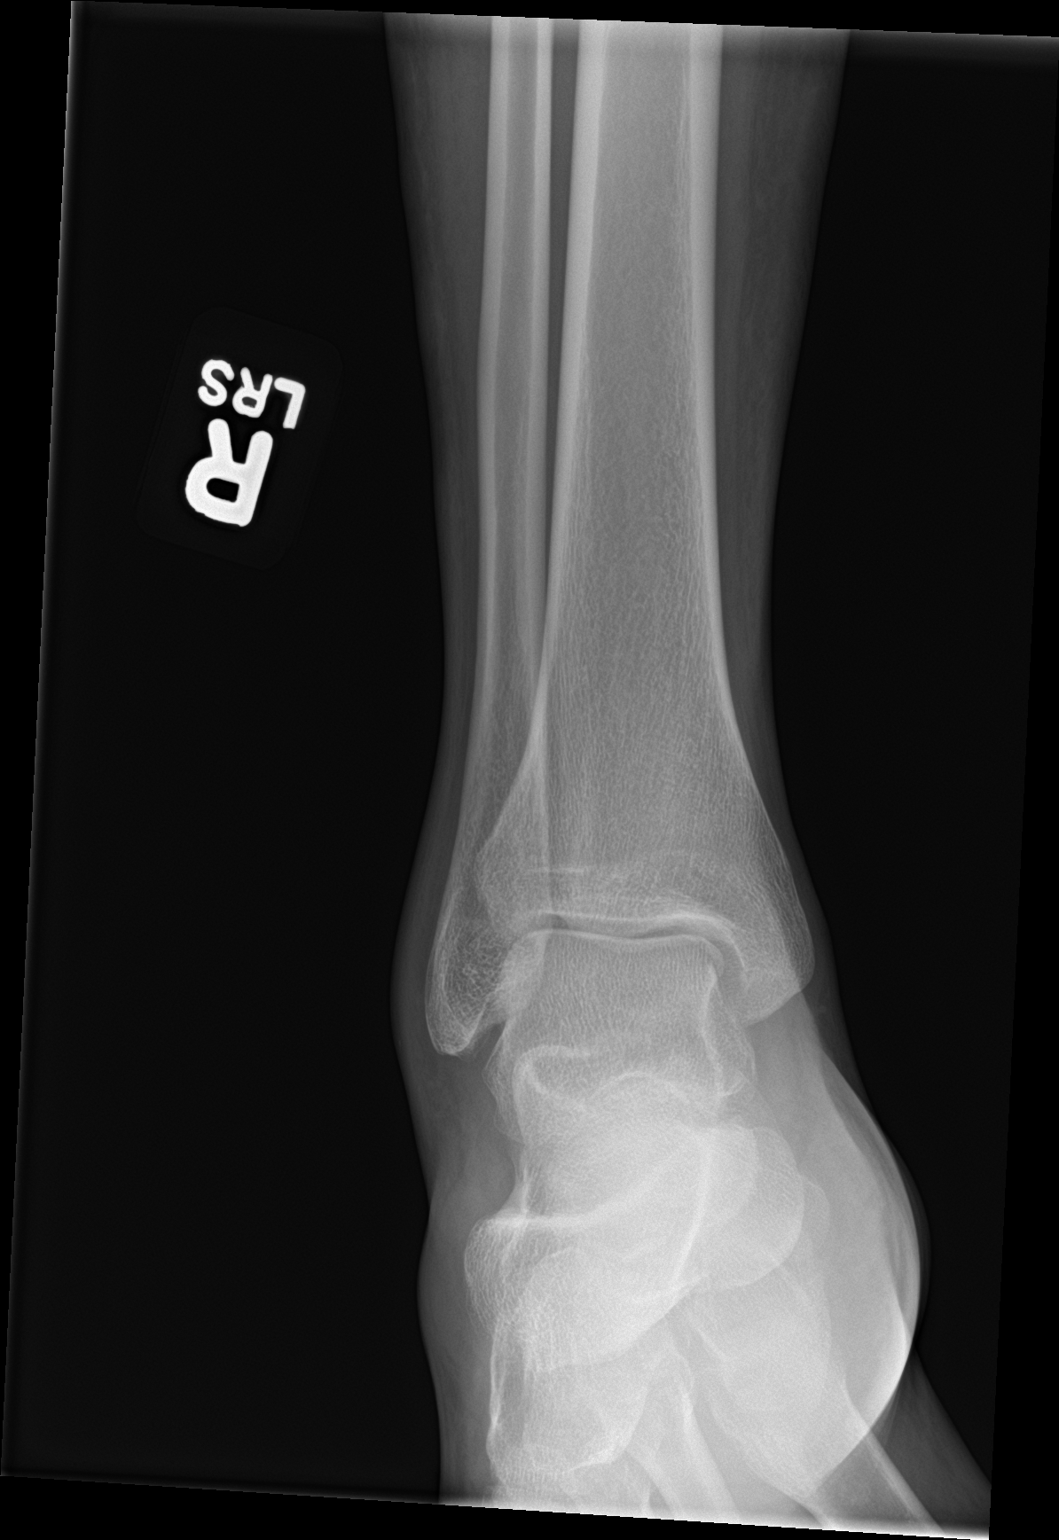

[ankle obl]
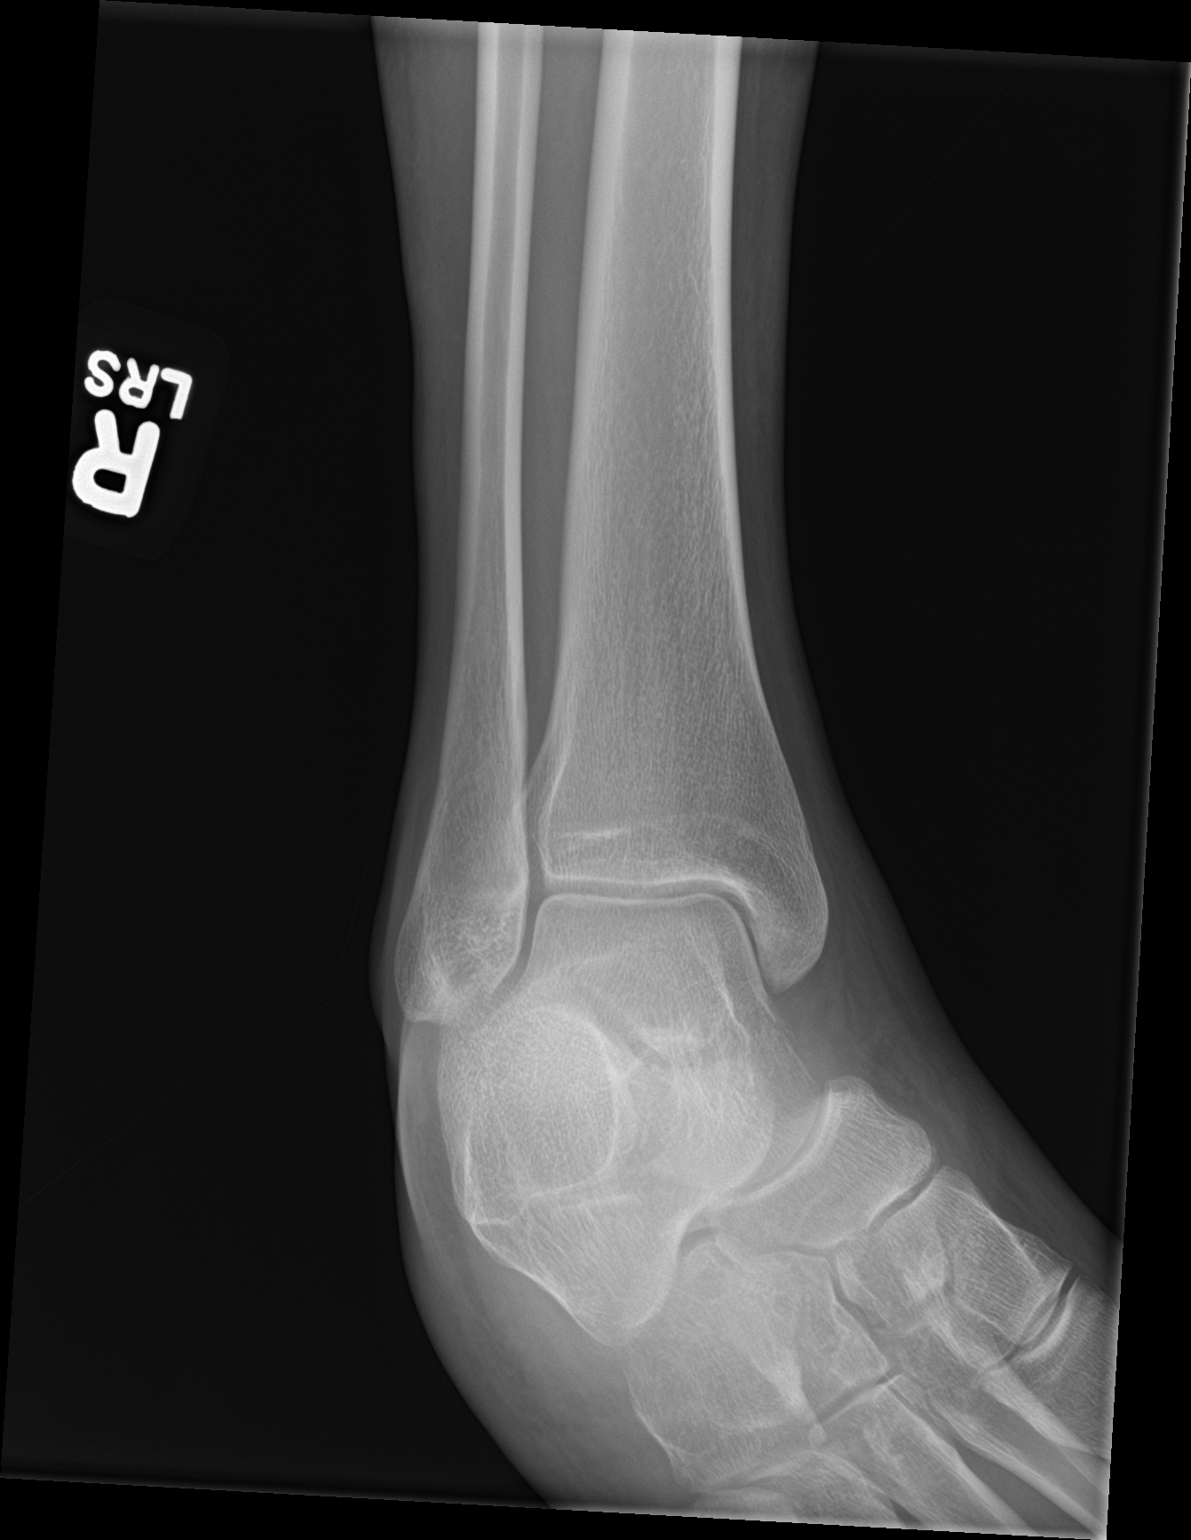

[ankle lat]
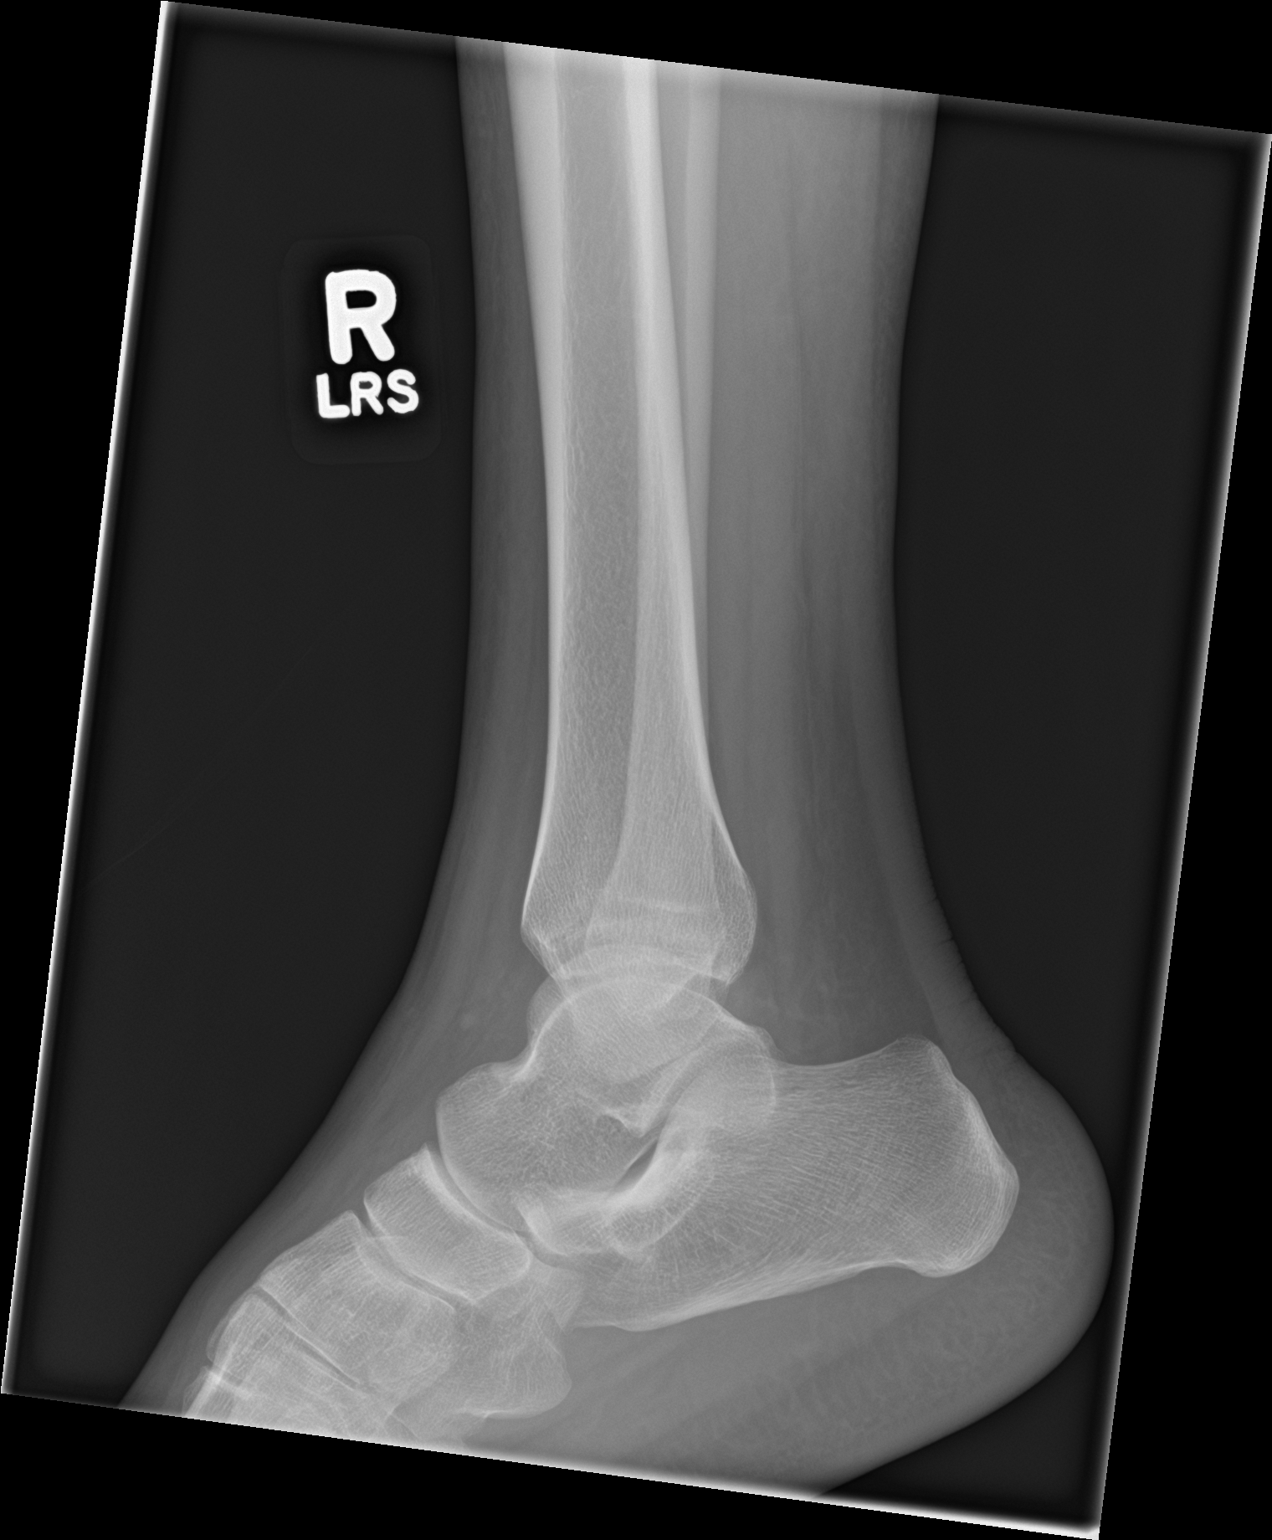

[3 of 3 positions shown; findings below may reference images not displayed]

FINDINGS: There is no evidence of fracture, dislocation, or joint effusion.
There is no evidence of arthropathy or other focal bone abnormality.
Soft tissues are unremarkable.
IMPRESSION: Negative.

## 2020-06-20 IMAGING — DX RIGHT FOOT COMPLETE - 3+ VIEW
3 series · 3 of 3 positions shown · non-contrast
Comparison: None.

CLINICAL DATA: Right ankle and foot pain after a fall tonight.
Swelling and abrasion to the right ankle. Abrasion to the left toe.

EXAM:
RIGHT FOOT COMPLETE - 3+ VIEW

[foot ap]
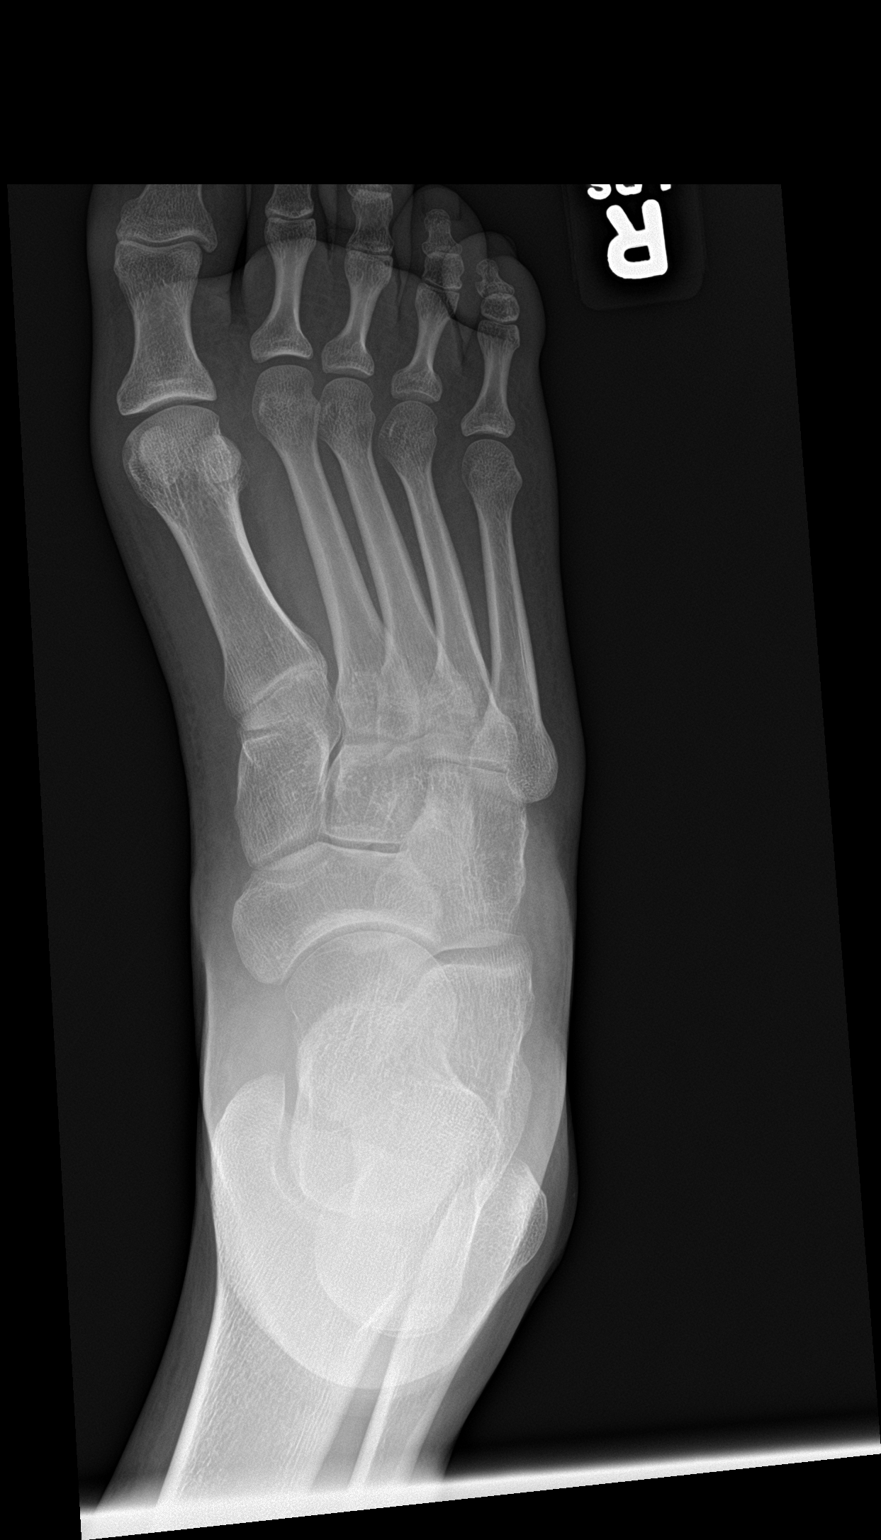

[foot obl]
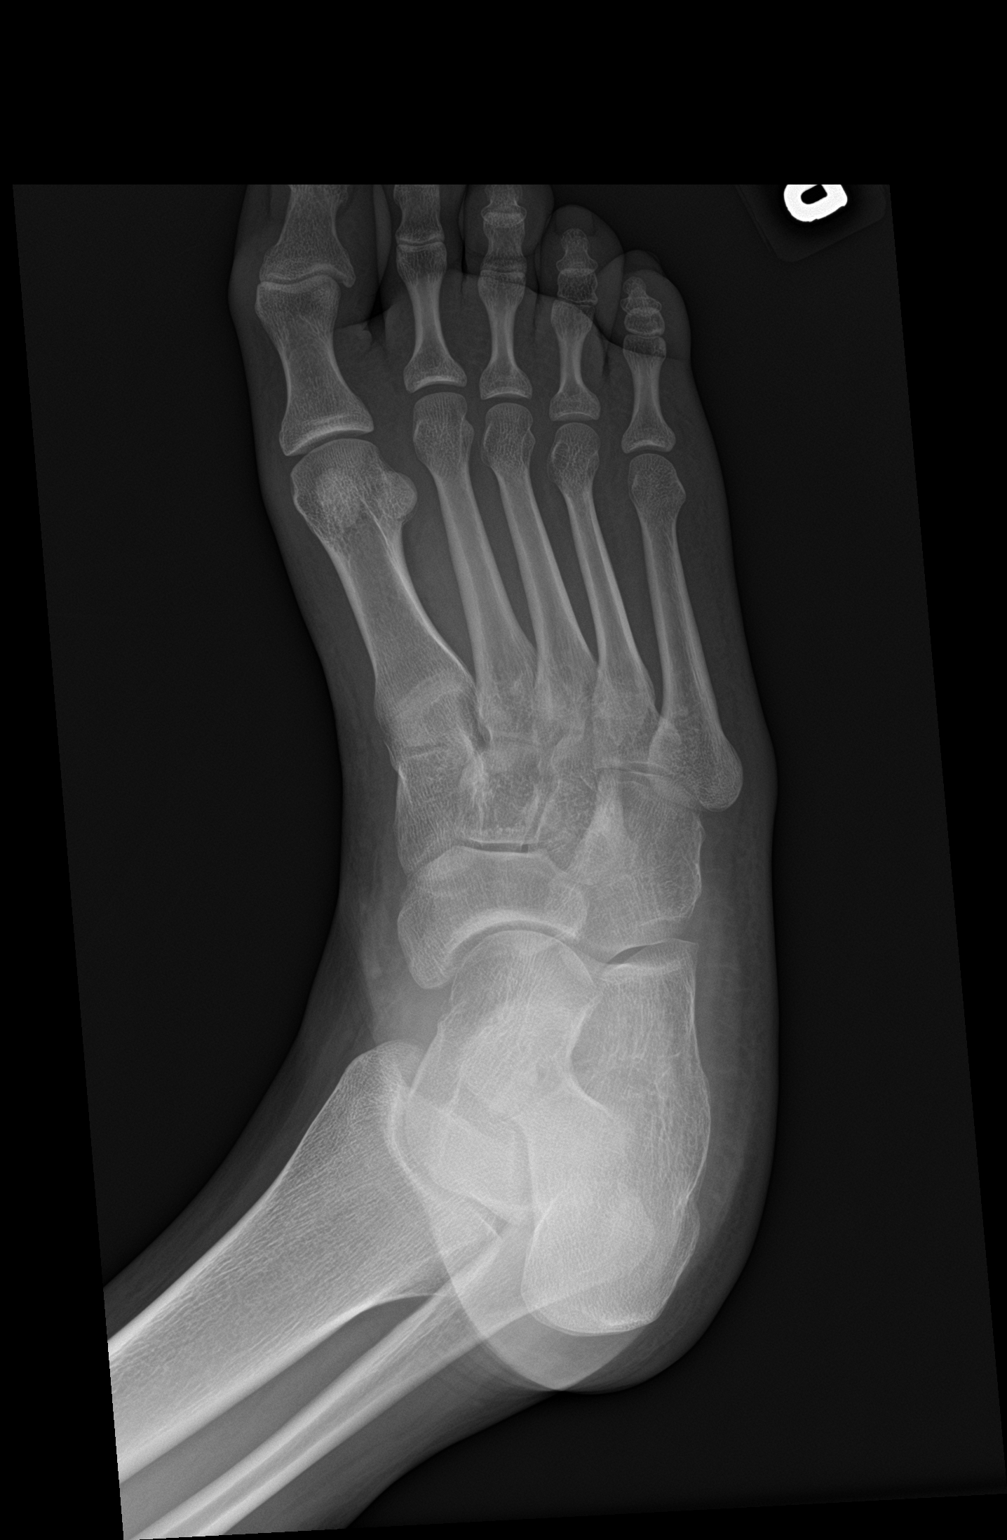

[foot lat]
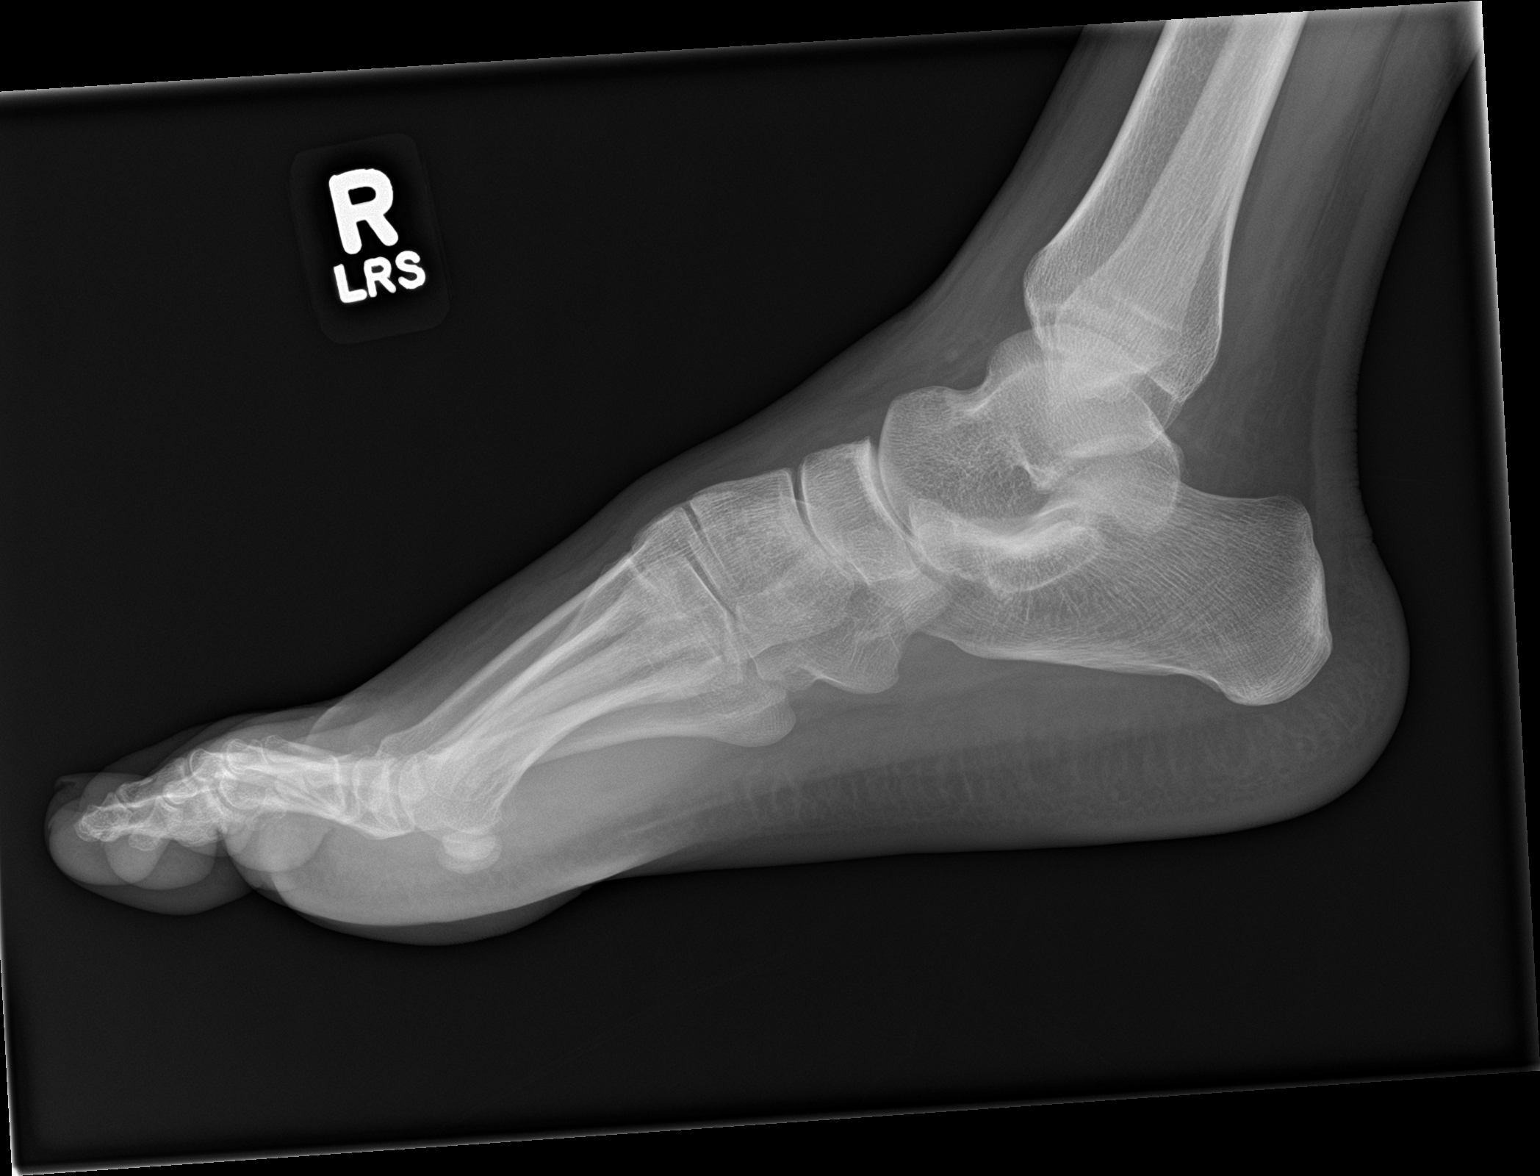

[3 of 3 positions shown; findings below may reference images not displayed]

FINDINGS: There is no evidence of fracture or dislocation. There is no
evidence of arthropathy or other focal bone abnormality. Soft
tissues are unremarkable.
IMPRESSION: Negative.

## 2023-06-16 ENCOUNTER — Inpatient Hospital Stay (HOSPITAL_COMMUNITY): Payer: MEDICAID

## 2023-06-16 ENCOUNTER — Inpatient Hospital Stay (HOSPITAL_COMMUNITY)
Admission: AD | Admit: 2023-06-16 | Discharge: 2023-06-16 | Disposition: A | Discharge status: Home / Self Care | Payer: MEDICAID | Attending: Obstetrics & Gynecology | Admitting: Obstetrics & Gynecology

## 2023-06-16 ENCOUNTER — Inpatient Hospital Stay (HOSPITAL_COMMUNITY)
Admission: AD | Admit: 2023-06-16 | Discharge: 2023-06-16 | Disposition: A | Discharge status: Home / Self Care | Payer: Self-pay | Attending: Obstetrics and Gynecology | Admitting: Obstetrics and Gynecology

## 2023-06-16 ENCOUNTER — Encounter (HOSPITAL_COMMUNITY): Payer: Self-pay

## 2023-06-16 DIAGNOSIS — O26891 Other specified pregnancy related conditions, first trimester: Secondary | ICD-10-CM | POA: Insufficient documentation

## 2023-06-16 DIAGNOSIS — O209 Hemorrhage in early pregnancy, unspecified: Secondary | ICD-10-CM | POA: Insufficient documentation

## 2023-06-16 DIAGNOSIS — R1030 Lower abdominal pain, unspecified: Secondary | ICD-10-CM | POA: Insufficient documentation

## 2023-06-16 DIAGNOSIS — Z711 Person with feared health complaint in whom no diagnosis is made: Secondary | ICD-10-CM

## 2023-06-16 DIAGNOSIS — Z3A01 Less than 8 weeks gestation of pregnancy: Secondary | ICD-10-CM

## 2023-06-16 DIAGNOSIS — O26851 Spotting complicating pregnancy, first trimester: Secondary | ICD-10-CM | POA: Insufficient documentation

## 2023-06-16 LAB — WET PREP, GENITAL
Clue Cells Wet Prep HPF POC: NONE SEEN
Sperm: NONE SEEN
Trich, Wet Prep: NONE SEEN
WBC, Wet Prep HPF POC: <10 (ref <10)
Yeast Wet Prep HPF POC: NONE SEEN

## 2023-06-16 LAB — URINALYSIS, ROUTINE W REFLEX MICROSCOPIC
Bilirubin Urine: NEGATIVE
Glucose, UA: NEGATIVE mg/dL
Ketones, ur: NEGATIVE mg/dL
Leukocytes,Ua: NEGATIVE
Nitrite: NEGATIVE
Protein, ur: NEGATIVE mg/dL
Specific Gravity, Urine: 1.024 (ref 1.005–1.030)
pH: 5 (ref 5.0–8.0)

## 2023-06-16 LAB — CBC
HCT: 41.7 % (ref 36.0–46.0)
Hemoglobin: 13.8 g/dL (ref 12.0–15.0)
MCH: 27.7 pg (ref 26.0–34.0)
MCHC: 33.1 g/dL (ref 30.0–36.0)
MCV: 83.6 fL (ref 80.0–100.0)
Platelets: 254 10*3/uL (ref 150–400)
RBC: 4.99 MIL/uL (ref 3.87–5.11)
RDW: 12.2 % (ref 11.5–15.5)
WBC: 5.8 10*3/uL (ref 4.0–10.5)
nRBC: 0 % (ref 0.0–0.2)

## 2023-06-16 LAB — HCG, QUANTITATIVE, PREGNANCY: hCG, Beta Chain, Quant, S: 37895 m[IU]/mL — ABNORMAL HIGH (ref <5)

## 2023-06-16 LAB — RAPID HIV SCREEN (HIV 1/2 AB+AG)
HIV 1/2 Antibodies: NONREACTIVE
HIV-1 P24 Antigen - HIV24: NONREACTIVE

## 2023-06-16 LAB — RPR: RPR Ser Ql: NONREACTIVE

## 2023-06-16 NOTE — MAU Provider Note (Signed)
History   Caitlin Herrera , a  27 y.o. G3P0010 at [redacted]w[redacted]d presents to MAU for concerns for VB and abdominal cramping. Patient was here in MAU earlier today for the same concerns and returns for unresolved symptoms. She reports left lower abdominal cramping and pink spotting when wiping that is intermittent. She denies clots or heavy bleeding. Denies N/V/D, fever or chills.   RN note:   Caitlin Herrera is a 27 y.o. at [redacted]w[redacted]d here in MAU reporting being here earlier today due to vag bleeding. Was told everything was ok after being seen. I am bleeding again and having some pain in lower stomach. Was told to return if she was bleeding or having pain. Bleeding tonight is pink.    Onset of complaint: this evening Pain score: 8  CSN: [id number]  Arrival date and time: 06/16/23 2035   Event Date/Time   First Provider Initiated Contact with Patient 06/16/23 2219      Chief Complaint  Patient presents with   Abdominal Pain   Vaginal Bleeding   Vaginal Bleeding The patient's pertinent negatives include no pelvic pain or vaginal discharge. This is a recurrent problem. The current episode started today. The problem occurs intermittently. The problem has been gradually improving. The pain is moderate. The problem affects the left side. She is pregnant. Associated symptoms include abdominal pain, constipation and dysuria. Pertinent negatives include no chills, fever, flank pain or vomiting.    OB History     Gravida  3   Para      Term      Preterm      AB  1   Living         SAB  1   IAB      Ectopic      Multiple      Live Births              No past medical history on file.  No past surgical history on file.  No family history on file.  Social History   Tobacco Use   Smoking status: Never  Substance Use Topics   Alcohol use: Never   Drug use: Never    Allergies: No Known Allergies  No medications prior to admission.    Review of Systems  Constitutional:   Negative for chills and fever.  Gastrointestinal:  Positive for abdominal pain and constipation. Negative for vomiting.  Genitourinary:  Positive for dysuria and vaginal bleeding. Negative for flank pain, pelvic pain and vaginal discharge.   Physical Exam   Blood pressure 110/61, pulse 80, temperature 97.7 F (36.5 C), resp. rate 18, height 5\' 9"  (1.753 m), weight 76.6 kg, last menstrual period 04/26/2023, SpO2 100%, unknown if currently breastfeeding.  Physical Exam Constitutional:      General: She is not in acute distress.    Appearance: She is not ill-appearing.  Cardiovascular:     Rate and Rhythm: Normal rate.  Pulmonary:     Effort: Pulmonary effort is normal.  Abdominal:     Tenderness: There is no abdominal tenderness. There is no guarding or rebound.  Skin:    General: Skin is warm and dry.  Neurological:     Mental Status: She is alert and oriented to person, place, and time.  Psychiatric:        Mood and Affect: Mood is anxious.        Behavior: Behavior normal.     MAU Course   Orders Placed This Encounter  Procedures  Discharge patient    MDM Vaginal bleeding in early pregnancy, unspecified Patient seen earlier today by provider and all labs and imaging placed. Labs unremarkable. Ultrasound confirmed an IUP with an hCG quant of [id number]. Patient was discharged.  Discharge home.  Assessment and Plan  Bleeding in early pregnancy, first trimester, unspecified Questions encouraged and answered Expectant management reviewed AVS and discharge teaching given and reviewed Safe med list Provider list Advised to follow-up with provider and establish prenatal care Return precautions given and reviewed  Darrell Jewel, SNM 06/16/2023, 10:29 PM

## 2023-06-16 NOTE — Discharge Instructions (Signed)

## 2023-06-16 NOTE — MAU Provider Note (Signed)
History     CSN: [id number]  Arrival date and time: 06/16/23 0908   None     Chief Complaint  Patient presents with   Vaginal Bleeding   HPI Patient presenting for evaluation for vaginal bleeding.  Reports that the vaginal bleeding started this morning.  She had intercourse night before last.  Reports a few episodes of left-sided abdominal pain which have completely resolved at this time.  Denies any other symptoms.  OB History     Gravida  3   Para      Term      Preterm      AB  1   Living         SAB  1   IAB      Ectopic      Multiple      Live Births              No past medical history on file.  No past surgical history on file.  No family history on file.  Social History   Tobacco Use   Smoking status: Never  Substance Use Topics   Alcohol use: Never   Drug use: Never    Allergies: No Known Allergies  Medications Prior to Admission  Medication Sig Dispense Refill Last Dose/Taking   Prenatal Vit-Fe Fumarate-FA (PRENATAL MULTIVITAMIN) TABS tablet Take 1 tablet by mouth daily at 12 noon.   Taking   doxycycline (ADOXA) 100 MG tablet Take 1 tablet (100 mg total) by mouth 2 (two) times daily. 14 tablet 0    ibuprofen (ADVIL,MOTRIN) 800 MG tablet Take 1 tablet (800 mg total) by mouth 3 (three) times daily. 21 tablet 0    methylergonovine (METHERGINE) 0.2 MG tablet Take 1 tablet (0.2 mg total) by mouth 3 (three) times daily. 6 tablet 0     Review of Systems  Constitutional:  Negative for chills, fatigue and fever.  Eyes:  Negative for visual disturbance.  Respiratory:  Negative for shortness of breath.   Gastrointestinal:  Negative for abdominal pain, constipation, diarrhea, nausea and vomiting.  Genitourinary:  Positive for vaginal bleeding. Negative for vaginal discharge.   Physical Exam   Blood pressure 113/66, pulse 91, temperature 99.2 F (37.3 C), temperature source Oral, resp. rate 16, height 5\' 9"  (1.753 m), weight 76.7 kg,  last menstrual period 04/26/2023, SpO2 100%, unknown if currently breastfeeding.  Physical Exam Vitals and nursing note reviewed.  HENT:     Head: Normocephalic and atraumatic.     Nose: Nose normal.     Mouth/Throat:     Mouth: Mucous membranes are moist.  Eyes:     Extraocular Movements: Extraocular movements intact.     Pupils: Pupils are equal, round, and reactive to light.  Cardiovascular:     Rate and Rhythm: Normal rate and regular rhythm.  Pulmonary:     Effort: Pulmonary effort is normal.     Breath sounds: Normal breath sounds.  Abdominal:     General: Abdomen is flat. There is no distension.     Tenderness: There is no abdominal tenderness. There is no guarding.  Musculoskeletal:        General: Normal range of motion.     Cervical back: Normal range of motion.  Skin:    General: Skin is warm.     Capillary Refill: Capillary refill takes less than 2 seconds.  Neurological:     General: No focal deficit present.     Mental Status: She is alert.  Psychiatric:        Mood and Affect: Mood normal.     MAU Course  Procedures  MDM Urinalysis Wet prep Beta-hCG quant's CBC Wet prep Ultrasound  Assessment and Plan  Caitlin Herrera is a 27 yo G3P0 @[redacted]w[redacted]d  presenting for vaginal bleeding.  Vaginal bleeding in early pregnancy Patient reports light vaginal bleeding starting today.  CBC within normal limits ultrasound showing single live IUP with crown-rump measuring 5 weeks 6 days.  No abnormalities identified.  Beta-hCG appropriate.  Likely related to recent intercourse.  Could still be threatened miscarriage.  Discussed strict return precautions.  Patient discharged home.  Celedonio Savage 06/16/2023, 2:11 PM

## 2023-06-16 NOTE — MAU Note (Signed)
.  Caitlin Herrera is a 27 y.o. at [redacted]w[redacted]d here in MAU reporting being here earlier today due to vag bleeding. Was told everything was ok after being seen. I am bleeding again and having some pain in lower stomach. Was told to return if she was bleeding or having pain. Bleeding tonight is pink.   Onset of complaint: this evening Pain score: 8 Vitals:   06/16/23 2101  BP: 110/61  Pulse: 80  Resp: 18  Temp: 97.7 F (36.5 C)  SpO2: 100%     FHT:n/a Lab orders placed from triage:  none

## 2023-06-16 NOTE — Progress Notes (Signed)
Written and verbal d/c instructions given by Dorathy Daft CNM using hospital spanish interpreter, [facility]. Pt voiced understanding

## 2023-06-16 NOTE — MAU Note (Signed)
.  Caitlin Herrera is a 27 y.o. at [redacted]w[redacted]d here in MAU reporting: Vaginal bleeding and intermittent lower abdominal cramping. She reports yesterday is when she began to feel the cramping. She reports when she woke up this morning she noted "a couple of small maroon drops of blood" in her underwear. She reports after using the restroom, she wiped and noted light pink. She denies pain today but reports her left lower abdomen feels "heavy." Last IC two nights ago.   [facility] Patient brought her verification letter with her.  LMP: 04/26/2023  Onset of complaint: Yesterday Pain score: Denies pain.  Vitals:   06/16/23 0926  BP: 113/66  Pulse: 91  Resp: 16  Temp: 99.2 F (37.3 C)  SpO2: 100%     FHT: n/a Lab orders placed from triage:  UA

## 2023-06-17 LAB — GC/CHLAMYDIA PROBE AMP (~~LOC~~) NOT AT ARMC
Chlamydia: NEGATIVE
Comment: NEGATIVE
Comment: NORMAL
Neisseria Gonorrhea: NEGATIVE

## 2023-06-25 ENCOUNTER — Inpatient Hospital Stay (HOSPITAL_COMMUNITY)
Admission: AD | Admit: 2023-06-25 | Discharge: 2023-06-26 | Disposition: A | Discharge status: Home / Self Care | Payer: MEDICAID | Attending: Obstetrics & Gynecology | Admitting: Obstetrics & Gynecology

## 2023-06-25 ENCOUNTER — Other Ambulatory Visit: Payer: Self-pay

## 2023-06-25 DIAGNOSIS — O039 Complete or unspecified spontaneous abortion without complication: Secondary | ICD-10-CM | POA: Insufficient documentation

## 2023-06-25 NOTE — MAU Note (Addendum)
.  Caitlin Herrera is a 27 y.o. at [redacted]w[redacted]d here in MAU reporting: was seen here last week for pain and bleeding. States bleeding stopped for 2 days but started again and has ongoing pain since being since. Reports went to hospital in [facility] yesterday for pain - baby's HR in 80's and told this might lead to miscarriage. Tonight pain is worse. Reports bleeding is light.   Pain score: 5 Vitals:   06/25/23 2245  BP: 125/71  Pulse: 99  Resp: 16  Temp: 98.6 F (37 C)  SpO2: 100%     FHT:NA  Lab orders placed from triage: none

## 2023-06-26 ENCOUNTER — Inpatient Hospital Stay (HOSPITAL_COMMUNITY): Payer: MEDICAID

## 2023-06-26 ENCOUNTER — Other Ambulatory Visit: Payer: Self-pay | Admitting: Certified Nurse Midwife

## 2023-06-26 DIAGNOSIS — O039 Complete or unspecified spontaneous abortion without complication: Secondary | ICD-10-CM

## 2023-06-26 LAB — WET PREP, GENITAL
Sperm: NONE SEEN
Trich, Wet Prep: NONE SEEN
WBC, Wet Prep HPF POC: <10 (ref <10)
Yeast Wet Prep HPF POC: NONE SEEN

## 2023-06-26 LAB — CBC
HCT: 40.5 % (ref 36.0–46.0)
Hemoglobin: 13.9 g/dL (ref 12.0–15.0)
MCH: 28.3 pg (ref 26.0–34.0)
MCHC: 34.3 g/dL (ref 30.0–36.0)
MCV: 82.3 fL (ref 80.0–100.0)
Platelets: 260 10*3/uL (ref 150–400)
RBC: 4.92 MIL/uL (ref 3.87–5.11)
RDW: 12.2 % (ref 11.5–15.5)
WBC: 7.6 10*3/uL (ref 4.0–10.5)
nRBC: 0 % (ref 0.0–0.2)

## 2023-06-26 LAB — COMPREHENSIVE METABOLIC PANEL
ALT: 21 U/L (ref 0–44)
AST: 20 U/L (ref 15–41)
Albumin: 4.2 g/dL (ref 3.5–5.0)
Alkaline Phosphatase: 50 U/L (ref 38–126)
Anion gap: 10 (ref 5–15)
BUN: <5 mg/dL — ABNORMAL LOW (ref 6–20)
CO2: 24 mmol/L (ref 22–32)
Calcium: 9.6 mg/dL (ref 8.9–10.3)
Chloride: 104 mmol/L (ref 98–111)
Creatinine, Ser: 0.66 mg/dL (ref 0.44–1.00)
GFR, Estimated: >60 mL/min (ref >60)
Glucose, Bld: 91 mg/dL (ref 70–99)
Potassium: 3.6 mmol/L (ref 3.5–5.1)
Sodium: 138 mmol/L (ref 135–145)
Total Bilirubin: 1.5 mg/dL — ABNORMAL HIGH (ref <1.2)
Total Protein: 7.2 g/dL (ref 6.5–8.1)

## 2023-06-26 LAB — ABO/RH: ABO/RH(D): O POS

## 2023-06-26 LAB — HCG, QUANTITATIVE, PREGNANCY: hCG, Beta Chain, Quant, S: 101254 m[IU]/mL — ABNORMAL HIGH (ref <5)

## 2023-06-26 MED ORDER — MISOPROSTOL 200 MCG PO TABS: ORAL_TABLET | ORAL | 1 refills | Status: AC

## 2023-06-26 MED ORDER — ONDANSETRON 4 MG PO TBDP
ORAL_TABLET | ORAL | Status: AC
  Filled 2023-06-26: qty 2

## 2023-06-26 MED ORDER — OXYCODONE-ACETAMINOPHEN 5-325 MG PO TABS
2.0000 | ORAL_TABLET | Freq: Once | ORAL | Status: AC
  Administered 2023-06-26: 2 via ORAL
  Filled 2023-06-26: qty 2

## 2023-06-26 MED ORDER — ONDANSETRON 4 MG PO TBDP
8.0000 mg | ORAL_TABLET | Freq: Once | ORAL | Status: AC
  Administered 2023-06-26: 8 mg via ORAL

## 2023-06-26 MED ORDER — MISOPROSTOL 200 MCG PO TABS
800.0000 ug | ORAL_TABLET | Freq: Once | ORAL | Status: AC
  Administered 2023-06-26: 800 ug via ORAL
  Filled 2023-06-26: qty 4

## 2023-06-26 MED ORDER — OXYCODONE-ACETAMINOPHEN 5-325 MG PO TABS: 1.0000 | ORAL_TABLET | Freq: Four times a day (QID) | ORAL | 0 refills | Status: AC | PRN

## 2023-06-26 NOTE — Discharge Instructions (Signed)
Ms. Majmudar,  You were seen tonight for a miscarriage. You were prescribed medication that you should take at home, if you don't have bleeding within 48 hours. This would mean that you will take it 06/28/23 at night time or 06/29/23 in the morning. I have also prescribed pain medication, you have several doses. You may also take ibuprofen (Motrin) 800 mg every 8 hours for pain as needed.  You should also follow up with an ultrasound within 2 weeks. I will send a message for them to schedule this appointment for you.   Thank you, Raiford Noble vista esta noche por un aborto espontneo. Le recetaron medicamentos que debe tomar en casa si no presenta sangrado dentro de las 48 horas. Esto significara que lo tomar el 31/12/24 por la noche o el 06/29/23 por la maana. Tambin le he recetado analgsicos, tiene varias dosis. Tambin puede tomar ibuprofeno (Motrin) 800 mg cada 8 horas para el dolor, segn sea necesario.  Tambin debe realizar un seguimiento con una ecografa dentro de las 2 semanas. Les enviar un mensaje para programar esta cita para usted.   Ronnald Nian

## 2023-06-26 NOTE — MAU Provider Note (Signed)
Chief Complaint:  Abdominal Pain   HPI   None     Caitlin Herrera is a 27 y.o. G3P0010 at [redacted]w[redacted]d who presents to maternity admissions reporting vaginal bleeding that had stopped 2 days ago, and then restarted, with pain. The pain has increased since she got here from a 5 to an 8. Reports that she has not taken anything for pain. She reports she was seen at [facility] yesterday and that she was told that she had a threatened miscarriage.     No past medical history on file. OB History  Gravida Para Term Preterm AB Living  3    1   SAB IAB Ectopic Multiple Live Births  1        # Outcome Date GA Lbr Len/2nd Weight Sex Type Anes PTL Lv  3 Current           2 SAB           1 Gravida            No past surgical history on file. No family history on file. Social History   Tobacco Use   Smoking status: Never  Substance Use Topics   Alcohol use: Never   Drug use: Never   No Known Allergies Medications Prior to Admission  Medication Sig Dispense Refill Last Dose/Taking   Prenatal Vit-Fe Fumarate-FA (PRENATAL MULTIVITAMIN) TABS tablet Take 1 tablet by mouth daily at 12 noon.       I have reviewed patient's Past Medical Hx, Surgical Hx, Family Hx, Social Hx, medications and allergies.   ROS  Pertinent items noted in HPI and remainder of comprehensive ROS otherwise negative.   PHYSICAL EXAM  Patient Vitals for the past 24 hrs:  BP Temp Temp src Pulse Resp SpO2 Height Weight  06/25/23 2245 125/71 98.6 F (37 C) Oral 99 16 100 % 5\' 9"  (1.753 m) 73.9 kg    Constitutional: Well-developed, well-nourished female in no acute distress.  Cardiovascular: normal rate & rhythm, warm and well-perfused Respiratory: normal effort, no problems with respiration noted GI: Abd soft, non-tender, non-distended MS: Extremities nontender, no edema, normal ROM Neurologic: Alert and oriented x 4.  GU: no CVA tenderness Pelvic: NEFG, physiologic discharge, no blood, cervix clean.  ions: Toco:     Labs: Results for orders placed or performed during the hospital encounter of 06/25/23 (from the past 24 hours)  Wet prep, genital     Status: Abnormal   Collection Time: 06/26/23  1:00 AM  Result Value Ref Range   Yeast Wet Prep HPF POC NONE SEEN NONE SEEN   Trich, Wet Prep NONE SEEN NONE SEEN   Clue Cells Wet Prep HPF POC PRESENT (A) NONE SEEN   WBC, Wet Prep HPF POC <10 <10   Sperm NONE SEEN   CBC     Status: None   Collection Time: 06/26/23  1:17 AM  Result Value Ref Range   WBC 7.6 4.0 - 10.5 K/uL   RBC 4.92 3.87 - 5.11 MIL/uL   Hemoglobin 13.9 12.0 - 15.0 g/dL   HCT 42.5 95.6 - 38.7 %   MCV 82.3 80.0 - 100.0 fL   MCH 28.3 26.0 - 34.0 pg   MCHC 34.3 30.0 - 36.0 g/dL   RDW 56.4 33.2 - 95.1 %   Platelets 260 150 - 400 K/uL   nRBC 0.0 0.0 - 0.2 %  ABO/Rh     Status: None   Collection Time: 06/26/23  1:17 AM  Result Value Ref Range   ABO/RH(D) O POS    No rh immune globuloin      NOT A RH IMMUNE GLOBULIN CANDIDATE, PT RH POSITIVE Performed at [facility], [address] [place], [place] [address]   hCG, quantitative, pregnancy     Status: Abnormal   Collection Time: 06/26/23  1:17 AM  Result Value Ref Range   hCG, Beta Chain, Quant, S 101,254 (H) <5 mIU/mL  Comprehensive metabolic panel     Status: Abnormal   Collection Time: 06/26/23  1:17 AM  Result Value Ref Range   Sodium 138 135 - 145 mmol/L   Potassium 3.6 3.5 - 5.1 mmol/L   Chloride 104 98 - 111 mmol/L   CO2 24 22 - 32 mmol/L   Glucose, Bld 91 70 - 99 mg/dL   BUN <5 (L) 6 - 20 mg/dL   Creatinine, Ser 6.04 0.44 - 1.00 mg/dL   Calcium 9.6 8.9 - 54.0 mg/dL   Total Protein 7.2 6.5 - 8.1 g/dL   Albumin 4.2 3.5 - 5.0 g/dL   AST 20 15 - 41 U/L   ALT 21 0 - 44 U/L   Alkaline Phosphatase 50 38 - 126 U/L   Total Bilirubin 1.5 (H) <1.2 mg/dL   GFR, Estimated >98 >11 mL/min   Anion gap 10 5 - 15    Imaging:  US OB Transvaginal Result Date: 06/26/2023 CLINICAL DATA:  Vaginal bleeding affecting in early  pregnancy. EXAM: TRANSVAGINAL OB ULTRASOUND TECHNIQUE: Transvaginal ultrasound was performed for complete evaluation of the gestation as well as the maternal uterus, adnexal regions, and pelvic cul-de-sac. COMPARISON:  Ultrasound 2 days ago 06/24/2023 FINDINGS: Intrauterine gestational sac: Single Yolk sac:  Visualized. Embryo:  Visualized. Cardiac Activity: Not Visualized. Heart Rate: 0 bpm CRL: 3.7 mm, previously 4.6 mm 6 w 0 d [place] EDC: 02/19/2024 Subchorionic hemorrhage:  Small. Maternal uterus/adnexae: The uterus is retroverted. Intrauterine gestation with absent fetal heart tones. Fetal pole is diminished in size over the past 2 days. Both ovaries are visualized and are normal and demonstrate blood flow. No adnexal mass. No pelvic free fluid. IMPRESSION: 1. The intrauterine gestation has absent fetal heart tones. Findings meet definitive criteria for failed pregnancy. This follows SRU consensus guidelines: Diagnostic Criteria for Nonviable Pregnancy Early in the First Trimester. [facility] Mis J Med [phone]. 2. Small subchorionic hemorrhage. Electronically Signed   By: Narda Rutherford M.D.   On: 06/26/2023 01:58    MDM & MAU COURSE  MDM: Reviewed previous visits, lab work and EMR. Interpreter used due to language barrier affecting healthcare.   MAU Course: Orders Placed This Encounter  Procedures   Wet prep, genital   US OB Transvaginal   CBC   hCG, quantitative, pregnancy   Comprehensive metabolic panel   Diet NPO time specified   ABO/Rh   Discharge patient   Meds ordered this encounter  Medications   misoprostol (CYTOTEC) tablet 800 mcg   oxyCODONE-acetaminophen (PERCOCET/ROXICET) 5-325 MG per tablet 2 tablet    Refill:  0   oxyCODONE-acetaminophen (PERCOCET/ROXICET) 5-325 MG tablet    Sig: Take 1 tablet by mouth every 6 (six) hours as needed for severe pain (pain score 7-10).    Dispense:  6 tablet    Refill:  0    Supervising Provider:   Reva Bores [[id number]]   misoprostol  (CYTOTEC) 200 MCG tablet    Sig: Place four tablets in between your gums and cheeks (two tablets on each side)  as instructed if you do not have bleeding 48 hours after your first dose of medication. This would mean you take it 06/28/23.    Dispense:  4 tablet    Refill:  1    Supervising Provider:   Reva Bores [[id number]]    ASSESSMENT   1. Miscarriage at 8 to [redacted] weeks gestation   - Ultrasound, labs and comparison to previous records consistent with failed IUP.   PLAN  Patient desires medical management for miscarriage. Pain medication and first dose of Misoprostol administered here. Prescription sent for a repeat of misoprostol in 48 hours if no bleeding. Prescription for 6 percocet sent for pain management.  Discharge home in stable condition with return precautions.  Message sent to office for follow up ultrasound in 1-2 weeks.  Return to MAU PRN.      Remote interpreter Reuel Boom [phone]  Allergies as of 06/26/2023   No Known Allergies      Medication List     TAKE these medications    misoprostol 200 MCG tablet Commonly known as: CYTOTEC Place four tablets in between your gums and cheeks (two tablets on each side) as instructed if you do not have bleeding 48 hours after your first dose of medication. This would mean you take it 06/28/23.   oxyCODONE-acetaminophen 5-325 MG tablet Commonly known as: PERCOCET/ROXICET Take 1 tablet by mouth every 6 (six) hours as needed for severe pain (pain score 7-10).   prenatal multivitamin Tabs tablet Take 1 tablet by mouth daily at 12 noon.        Lamont Snowball, MSN, CNM, RNC-OB Certified Nurse Midwife, [facility] 06/26/2023 3:42 AM

## 2023-06-27 LAB — GC/CHLAMYDIA PROBE AMP (~~LOC~~) NOT AT ARMC
Chlamydia: NEGATIVE
Comment: NEGATIVE
Comment: NORMAL
Neisseria Gonorrhea: NEGATIVE

## 2023-07-12 ENCOUNTER — Encounter: Payer: Self-pay | Admitting: Obstetrics and Gynecology

## 2023-07-19 ENCOUNTER — Other Ambulatory Visit: Payer: Self-pay

## 2023-10-20 ENCOUNTER — Other Ambulatory Visit: Payer: Self-pay

## 2023-10-20 ENCOUNTER — Encounter (HOSPITAL_COMMUNITY): Payer: Self-pay | Admitting: *Deleted

## 2023-10-20 ENCOUNTER — Emergency Department (HOSPITAL_COMMUNITY)
Admission: EM | Admit: 2023-10-20 | Discharge: 2023-10-20 | Discharge status: Against Medical Advice | Payer: MEDICAID | Attending: Emergency Medicine | Admitting: Emergency Medicine

## 2023-10-20 DIAGNOSIS — R350 Frequency of micturition: Secondary | ICD-10-CM | POA: Insufficient documentation

## 2023-10-20 DIAGNOSIS — R109 Unspecified abdominal pain: Secondary | ICD-10-CM | POA: Insufficient documentation

## 2023-10-20 DIAGNOSIS — Z5321 Procedure and treatment not carried out due to patient leaving prior to being seen by health care provider: Secondary | ICD-10-CM | POA: Insufficient documentation

## 2023-10-20 LAB — CBC
HCT: 40.9 % (ref 36.0–46.0)
Hemoglobin: 13.4 g/dL (ref 12.0–15.0)
MCH: 28.1 pg (ref 26.0–34.0)
MCHC: 32.8 g/dL (ref 30.0–36.0)
MCV: 85.7 fL (ref 80.0–100.0)
Platelets: 239 10*3/uL (ref 150–400)
RBC: 4.77 MIL/uL (ref 3.87–5.11)
RDW: 12.2 % (ref 11.5–15.5)
WBC: 4.8 10*3/uL (ref 4.0–10.5)
nRBC: 0 % (ref 0.0–0.2)

## 2023-10-20 LAB — COMPREHENSIVE METABOLIC PANEL WITH GFR
ALT: 33 U/L (ref 0–44)
AST: 29 U/L (ref 15–41)
Albumin: 4 g/dL (ref 3.5–5.0)
Alkaline Phosphatase: 51 U/L (ref 38–126)
Anion gap: 10 (ref 5–15)
BUN: 10 mg/dL (ref 6–20)
CO2: 23 mmol/L (ref 22–32)
Calcium: 9.4 mg/dL (ref 8.9–10.3)
Chloride: 103 mmol/L (ref 98–111)
Creatinine, Ser: 0.82 mg/dL (ref 0.44–1.00)
GFR, Estimated: >60 mL/min (ref >60)
Glucose, Bld: 94 mg/dL (ref 70–99)
Potassium: 3.5 mmol/L (ref 3.5–5.1)
Sodium: 136 mmol/L (ref 135–145)
Total Bilirubin: 0.7 mg/dL (ref 0.0–1.2)
Total Protein: 6.8 g/dL (ref 6.5–8.1)

## 2023-10-20 LAB — URINALYSIS, ROUTINE W REFLEX MICROSCOPIC
Bilirubin Urine: NEGATIVE
Glucose, UA: NEGATIVE mg/dL
Hgb urine dipstick: NEGATIVE
Ketones, ur: NEGATIVE mg/dL
Leukocytes,Ua: NEGATIVE
Nitrite: NEGATIVE
Protein, ur: NEGATIVE mg/dL
Specific Gravity, Urine: 1.017 (ref 1.005–1.030)
pH: 6 (ref 5.0–8.0)

## 2023-10-20 LAB — HCG, SERUM, QUALITATIVE: Preg, Serum: NEGATIVE

## 2023-10-20 LAB — LIPASE, BLOOD: Lipase: 49 U/L (ref 11–51)

## 2023-10-20 MED ORDER — OXYCODONE-ACETAMINOPHEN 5-325 MG PO TABS
1.0000 | ORAL_TABLET | Freq: Once | ORAL | Status: AC
  Administered 2023-10-20: 1 via ORAL
  Filled 2023-10-20: qty 1

## 2023-10-20 NOTE — ED Notes (Signed)
 Called X 1 no answer,

## 2023-10-20 NOTE — ED Notes (Signed)
 Checked very patient and arm band, Pt. Is not in the waiting room

## 2023-10-20 NOTE — ED Notes (Signed)
 Unable to find find in waiting room.

## 2023-10-20 NOTE — ED Triage Notes (Signed)
 The pt is c/l rt flank pain  for one hour  she had the same last week  urinary frequency  lmp yesterday
